# Patient Record
Sex: Female | Born: 1995 | Race: Black or African American | Hispanic: No | Marital: Single | Smoking: Never smoker
Health system: Southern US, Community
[De-identification: ages and names within clinical notes are randomized; demographics above are authoritative.]

## PROBLEM LIST (undated history)

## (undated) ENCOUNTER — Inpatient Hospital Stay (HOSPITAL_COMMUNITY): Payer: Self-pay

## (undated) DIAGNOSIS — Z789 Other specified health status: Secondary | ICD-10-CM

## (undated) DIAGNOSIS — O0930 Supervision of pregnancy with insufficient antenatal care, unspecified trimester: Secondary | ICD-10-CM

## (undated) DIAGNOSIS — R51 Headache: Secondary | ICD-10-CM

## (undated) DIAGNOSIS — B009 Herpesviral infection, unspecified: Secondary | ICD-10-CM

## (undated) DIAGNOSIS — G43909 Migraine, unspecified, not intractable, without status migrainosus: Secondary | ICD-10-CM

## (undated) DIAGNOSIS — K219 Gastro-esophageal reflux disease without esophagitis: Secondary | ICD-10-CM

## (undated) DIAGNOSIS — M069 Rheumatoid arthritis, unspecified: Secondary | ICD-10-CM

## (undated) HISTORY — DX: Rheumatoid arthritis, unspecified: M06.9

## (undated) HISTORY — DX: Gastro-esophageal reflux disease without esophagitis: K21.9

## (undated) HISTORY — PX: NO PAST SURGERIES: SHX2092

## (undated) HISTORY — DX: Migraine, unspecified, not intractable, without status migrainosus: G43.909

## (undated) HISTORY — DX: Supervision of pregnancy with insufficient antenatal care, unspecified trimester: O09.30

---
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
## 2015-05-23 ENCOUNTER — Encounter (HOSPITAL_COMMUNITY): Payer: Self-pay | Admitting: *Deleted

## 2015-05-23 ENCOUNTER — Emergency Department (HOSPITAL_COMMUNITY)
Admission: EM | Admit: 2015-05-23 | Discharge: 2015-05-24 | Discharge status: Against Medical Advice | Payer: BLUE CROSS/BLUE SHIELD | Attending: Emergency Medicine | Admitting: Emergency Medicine

## 2015-05-23 DIAGNOSIS — B20 Human immunodeficiency virus [HIV] disease: Secondary | ICD-10-CM | POA: Insufficient documentation

## 2015-05-23 DIAGNOSIS — J069 Acute upper respiratory infection, unspecified: Secondary | ICD-10-CM | POA: Diagnosis present

## 2015-05-23 NOTE — ED Notes (Signed)
Pt states that she has had cough and congestion for 1 month; pt states that she began to get worse one day last week; pt states that she now has pressure to head and ears; worse when she bends over; pt c/o cough and congestion; pt states that she sometimes has a productive cough with yellow sputum

## 2015-06-02 ENCOUNTER — Encounter (HOSPITAL_COMMUNITY): Payer: Self-pay | Admitting: *Deleted

## 2015-06-02 ENCOUNTER — Emergency Department (HOSPITAL_COMMUNITY)
Admission: EM | Admit: 2015-06-02 | Discharge: 2015-06-03 | Disposition: A | Discharge status: Home / Self Care | Payer: BLUE CROSS/BLUE SHIELD | Attending: Emergency Medicine | Admitting: Emergency Medicine

## 2015-06-02 DIAGNOSIS — R05 Cough: Secondary | ICD-10-CM

## 2015-06-02 DIAGNOSIS — Z8619 Personal history of other infectious and parasitic diseases: Secondary | ICD-10-CM | POA: Diagnosis not present

## 2015-06-02 DIAGNOSIS — Z79899 Other long term (current) drug therapy: Secondary | ICD-10-CM | POA: Diagnosis not present

## 2015-06-02 DIAGNOSIS — Z792 Long term (current) use of antibiotics: Secondary | ICD-10-CM | POA: Diagnosis not present

## 2015-06-02 DIAGNOSIS — R059 Cough, unspecified: Secondary | ICD-10-CM

## 2015-06-02 DIAGNOSIS — Z880 Allergy status to penicillin: Secondary | ICD-10-CM | POA: Diagnosis not present

## 2015-06-02 DIAGNOSIS — Z8679 Personal history of other diseases of the circulatory system: Secondary | ICD-10-CM | POA: Diagnosis not present

## 2015-06-02 DIAGNOSIS — J0100 Acute maxillary sinusitis, unspecified: Secondary | ICD-10-CM | POA: Insufficient documentation

## 2015-06-02 NOTE — ED Notes (Signed)
Pt states that she has had cold sx for approx 1 month; pt states that over the last week the congestion has gotten worse and she has pressure to her, cough and congestion; pt also c/o body aches

## 2015-06-03 ENCOUNTER — Emergency Department (HOSPITAL_COMMUNITY): Payer: BLUE CROSS/BLUE SHIELD

## 2015-06-03 DIAGNOSIS — J0100 Acute maxillary sinusitis, unspecified: Secondary | ICD-10-CM | POA: Diagnosis not present

## 2015-06-03 LAB — RAPID STREP SCREEN (MED CTR MEBANE ONLY): Streptococcus, Group A Screen (Direct): NEGATIVE

## 2015-06-03 MED ORDER — SULFAMETHOXAZOLE-TRIMETHOPRIM 800-160 MG PO TABS
1.0000 | ORAL_TABLET | Freq: Once | ORAL | Status: AC
  Administered 2015-06-03: 1 via ORAL
  Filled 2015-06-03 (×2): qty 1

## 2015-06-03 MED ORDER — DEXTROMETHORPHAN POLISTIREX ER 30 MG/5ML PO SUER: 30.0000 mg | ORAL | Status: DC | PRN

## 2015-06-03 MED ORDER — SULFAMETHOXAZOLE-TRIMETHOPRIM 800-160 MG PO TABS: 1.0000 | ORAL_TABLET | Freq: Two times a day (BID) | ORAL | Status: AC

## 2015-06-03 NOTE — Discharge Instructions (Signed)
Take Bactrim as directed until gone. Take delsym as needed for cough. Refer to attached documents for more information. Return to the ED with worsening or concerning symptoms.

## 2015-06-03 NOTE — ED Provider Notes (Signed)
CSN: [id number]     Arrival date & time 06/02/15  2333 History   First MD Initiated Contact with Patient 06/02/15 2352     Chief Complaint  Patient presents with  . URI     (Consider location/radiation/quality/duration/timing/severity/associated sxs/prior Treatment) Patient is a 18 y.o. female presenting with URI. The history is provided by the patient. No language interpreter was used.  URI Presenting symptoms: congestion, cough and sore throat   Congestion:    Location:  Nasal   Interferes with sleep: yes     Interferes with eating/drinking: yes   Severity:  Moderate Onset quality:  Gradual Duration:  1 month Timing:  Constant Progression:  Unchanged Chronicity:  New Relieved by:  Nothing Worsened by:  Nothing tried Ineffective treatments:  None tried Associated symptoms: sinus pain   Associated symptoms: no arthralgias, no headaches, no myalgias, no neck pain, no sneezing and no swollen glands   Risk factors: not elderly, no chronic cardiac disease, no chronic kidney disease, no chronic respiratory disease, no diabetes mellitus, no immunosuppression, no recent illness, no recent travel and no sick contacts     Past Medical History  Diagnosis Date  . No pertinent past medical history   . Headache(784.0)     migraines dx about 2 years ago  . HSV infection   . Migraine   . Late prenatal care    Past Surgical History  Procedure Laterality Date  . No past surgeries     Family History  Problem Relation Age of Onset  . Hypertension Mother   . Anemia Mother   . Migraines Mother   . Diabetes Father   . Deep vein thrombosis Maternal Grandmother   . Hypertension Maternal Grandmother   . Stroke Maternal Grandmother   . Heart attack Paternal Grandmother   . Hypertension Paternal Grandmother   . Diabetes Paternal Grandmother   . Gout Paternal Grandmother    Social History  Substance Use Topics  . Smoking status: Never Smoker   . Smokeless tobacco: Never Used  .  Alcohol Use: No   OB History    Gravida Para Term Preterm AB TAB SAB Ectopic Multiple Living   1 1 1       1      Review of Systems  HENT: Positive for congestion, sinus pressure and sore throat. Negative for sneezing.   Respiratory: Positive for cough.   Musculoskeletal: Negative for myalgias, arthralgias and neck pain.  Neurological: Negative for headaches.  All other systems reviewed and are negative.     Allergies  Penicillins  Home Medications   Prior to Admission medications   Medication Sig Start Date End Date Taking? Authorizing Provider  clindamycin (CLEOCIN) 300 MG capsule Take 1 capsule (300 mg total) by mouth 4 (four) times daily. 02/15/14   04/17/14, MD  HYDROcodone-acetaminophen (NORCO/VICODIN) 5-325 MG per tablet 1 to 2 tabs every 4 to 6 hours as needed for pain. 02/15/14   04/17/14, MD  ibuprofen (ADVIL,MOTRIN) 600 MG tablet Take 1 tablet (600 mg total) by mouth every 6 (six) hours. 07/17/13   13/8/14, MD  mupirocin ointment (BACTROBAN) 2 % Apply 1 application topically 3 (three) times daily. 02/15/14   04/17/14, MD  Prenatal Vit-Fe Fumarate-FA (PRENATAL MULTIVITAMIN) TABS tablet Take 1 tablet by mouth daily at 12 noon.    Historical Provider, MD   BP 109/63 mmHg  Pulse 101  Temp(Src) 98.4 F (36.9 C) (Oral)  Resp 20  SpO2  97%  LMP 04/24/2015 Physical Exam  Constitutional: She is oriented to person, place, and time. She appears well-developed and well-nourished. No distress.  HENT:  Head: Normocephalic and atraumatic.  Bilateral tonsillar edema, erythema and exudate.   Eyes: Conjunctivae and EOM are normal.  Neck: Normal range of motion.  Cardiovascular: Normal rate and regular rhythm.  Exam reveals no gallop and no friction rub.   No murmur heard. Pulmonary/Chest: Effort normal and breath sounds normal. She has no wheezes. She has no rales. She exhibits no tenderness.  Abdominal: Soft. She exhibits no distension. There is no  tenderness.  Musculoskeletal: Normal range of motion.  Lymphadenopathy:    She has cervical adenopathy.  Neurological: She is alert and oriented to person, place, and time. Coordination normal.  Speech is goal-oriented. Moves limbs without ataxia.   Skin: Skin is warm and dry.  Psychiatric: She has a normal mood and affect. Her behavior is normal.  Nursing note and vitals reviewed.   ED Course  Procedures (including critical care time) Labs Review Labs Reviewed  RAPID STREP SCREEN (NOT AT [facility])    Imaging Review Dg Chest 2 View  06/03/2015   CLINICAL DATA:  Generalized chest pain.  Cough and congestion.  EXAM: CHEST  2 VIEW  COMPARISON:  None.  FINDINGS: The heart size and mediastinal contours are within normal limits. Both lungs are clear. The visualized skeletal structures are unremarkable.  IMPRESSION: No active cardiopulmonary disease.   Electronically Signed   By: Annia Belt M.D.   On: 06/03/2015 00:21   I have personally reviewed and evaluated these images and lab results as part of my medical decision-making.   EKG Interpretation None      MDM   Final diagnoses:  Acute maxillary sinusitis, recurrence not specified  Cough    12:32 AM Chest xray unremarkable for acute changes. Rapid strep pending. Vitals stable and patient afebrile. Patient will be treated for sinus infection.    [address] [place], PA-C 06/03/15 0947  Raeford Razor, MD 06/05/15 1415

## 2015-06-05 LAB — CULTURE, GROUP A STREP

## 2016-07-27 IMAGING — CR DG CHEST 2V
2 series · 2 of 2 positions shown · non-contrast
Comparison: None.

CLINICAL DATA: Generalized chest pain.  Cough and congestion.

EXAM:
CHEST  2 VIEW

[w chest pa]
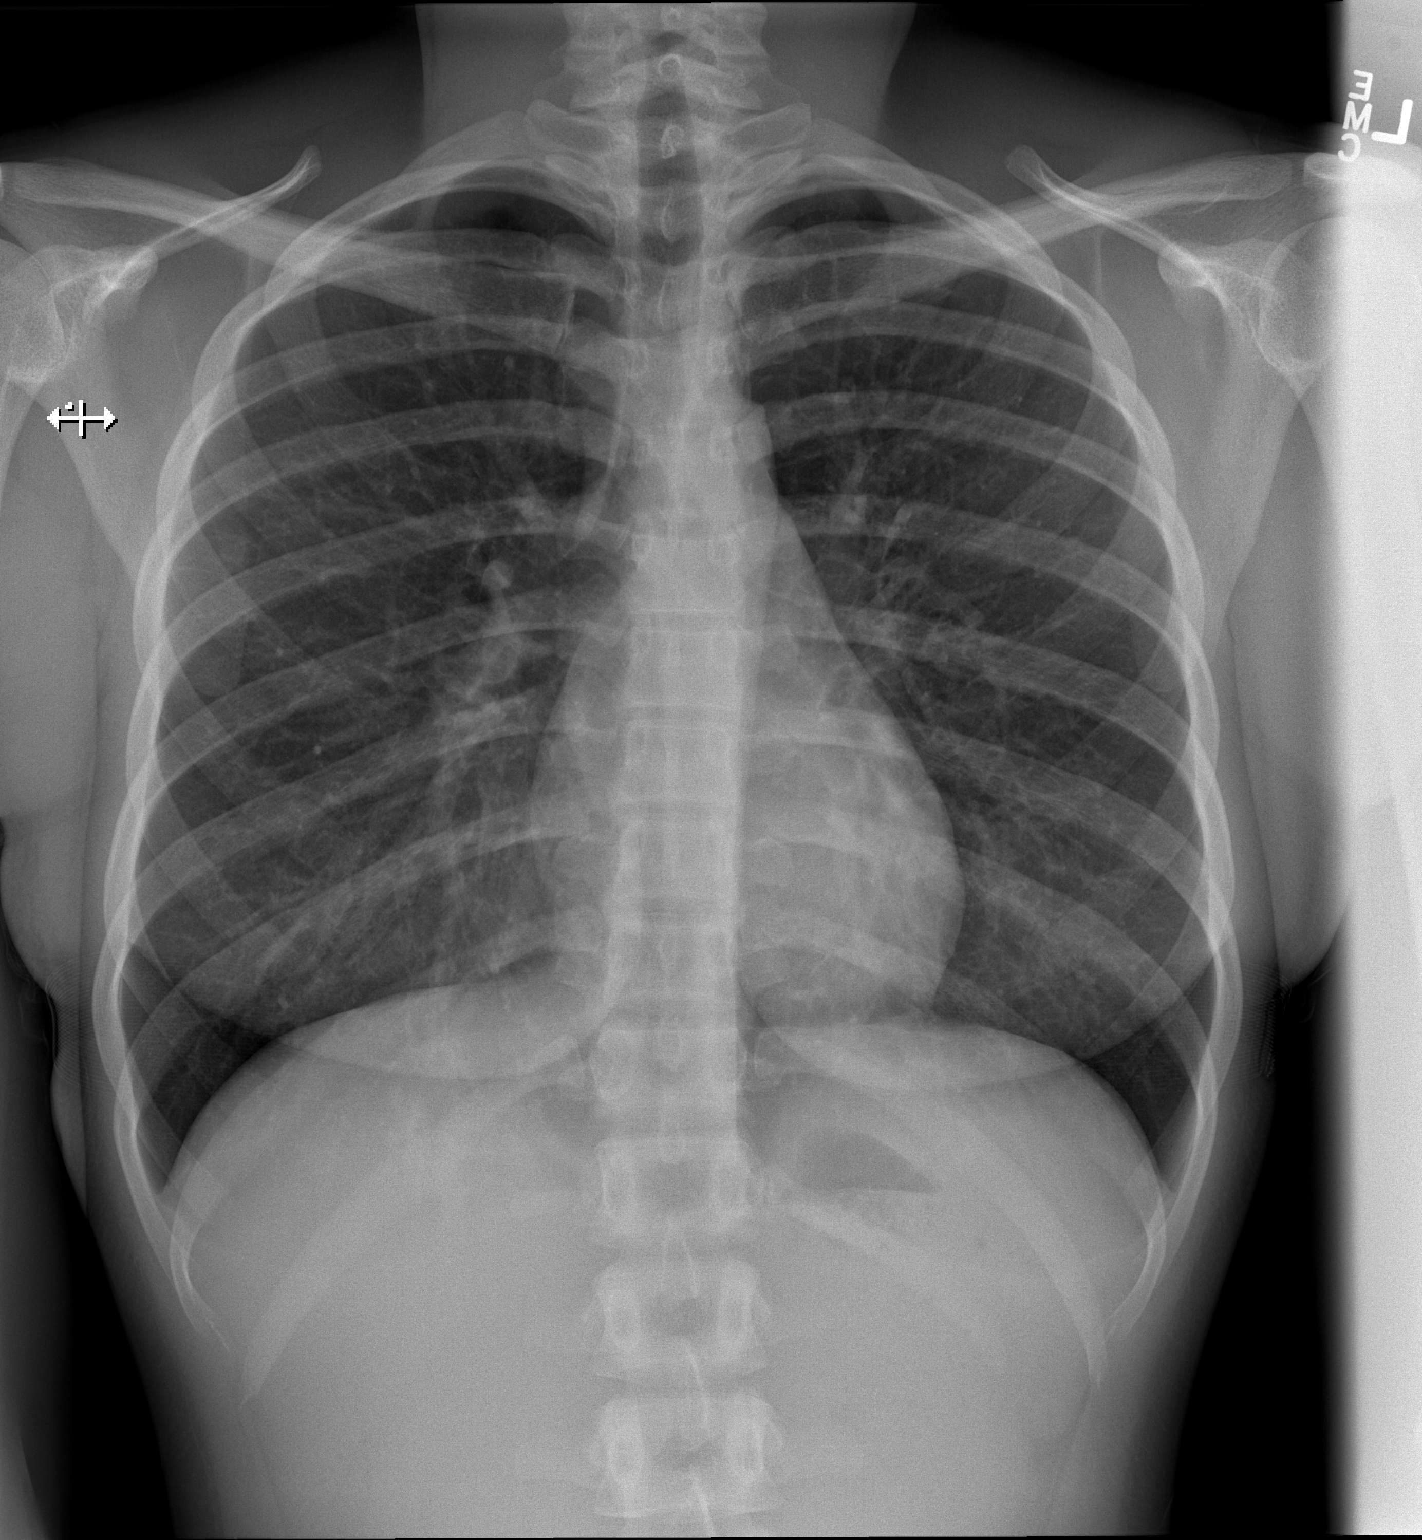

[w chest lat]
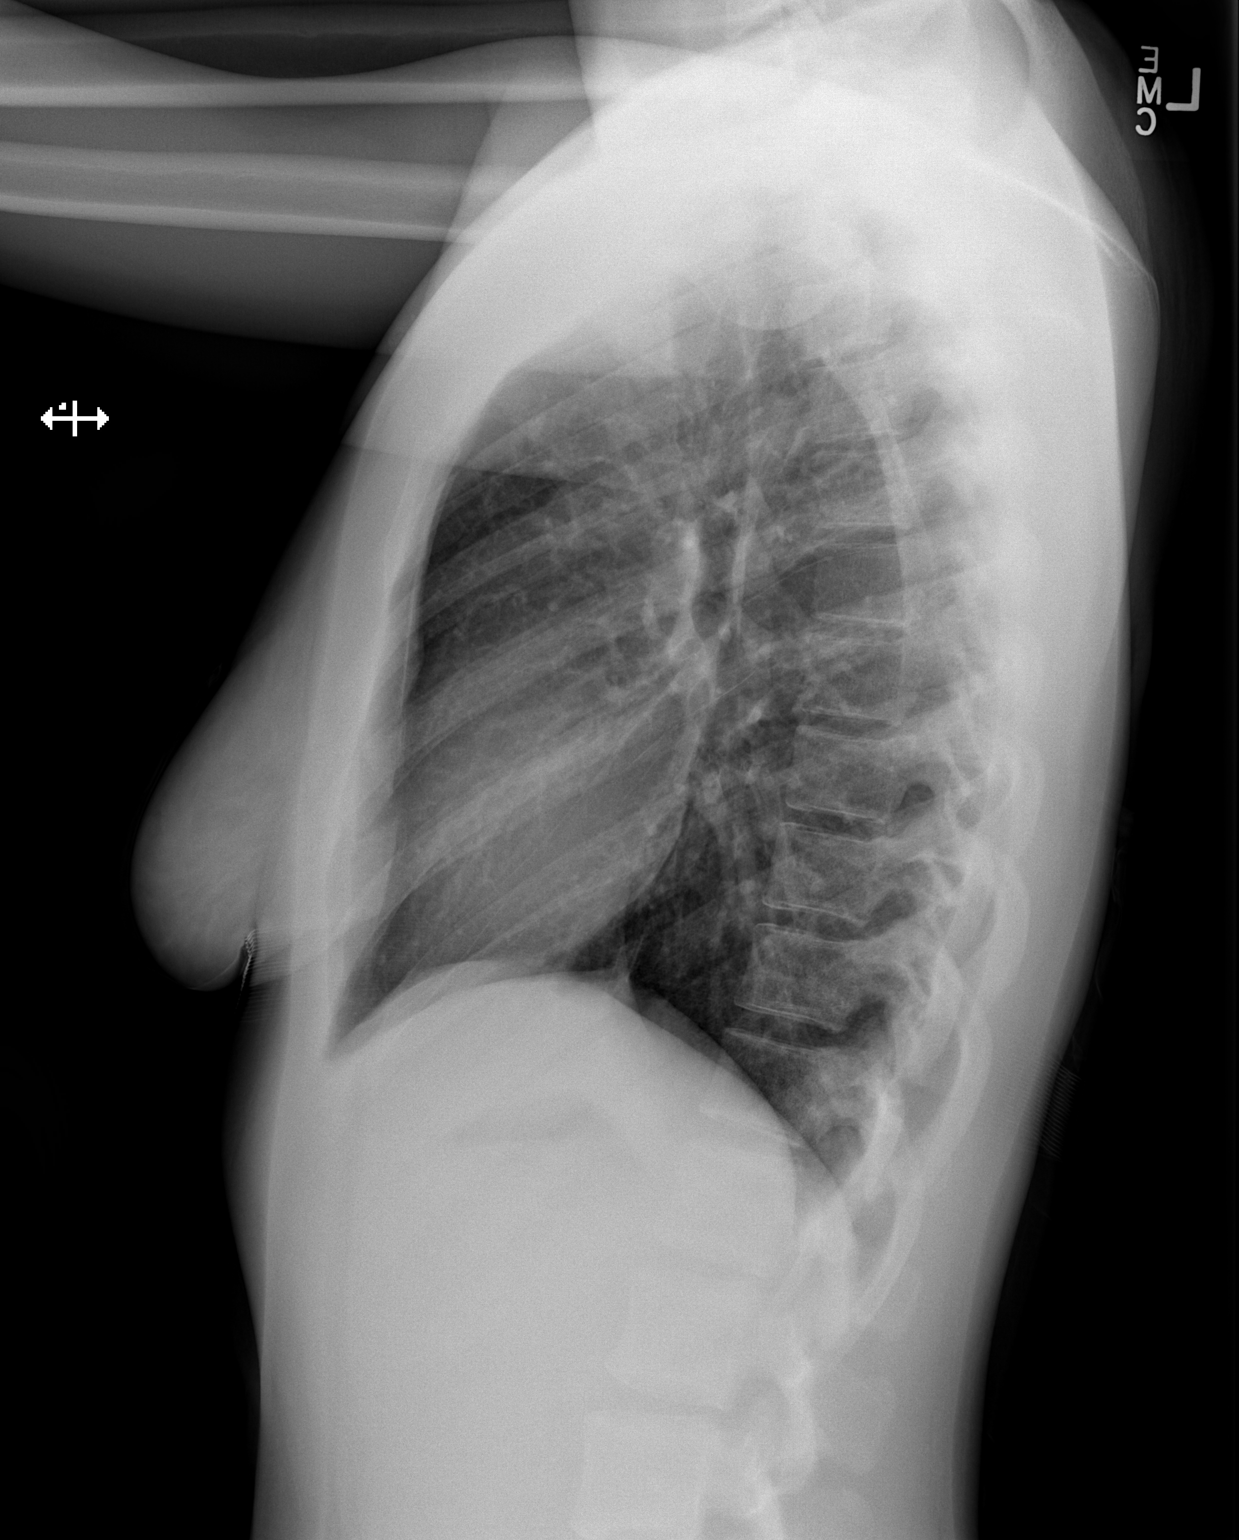

[2 of 2 positions shown; findings below may reference images not displayed]

FINDINGS: The heart size and mediastinal contours are within normal limits.
Both lungs are clear. The visualized skeletal structures are
unremarkable.
IMPRESSION: No active cardiopulmonary disease.

## 2017-06-04 ENCOUNTER — Encounter (HOSPITAL_COMMUNITY): Payer: Self-pay | Admitting: Emergency Medicine

## 2017-06-04 DIAGNOSIS — Z880 Allergy status to penicillin: Secondary | ICD-10-CM | POA: Insufficient documentation

## 2017-06-04 DIAGNOSIS — R52 Pain, unspecified: Secondary | ICD-10-CM | POA: Insufficient documentation

## 2017-06-04 DIAGNOSIS — Z79899 Other long term (current) drug therapy: Secondary | ICD-10-CM | POA: Insufficient documentation

## 2017-06-04 LAB — URINALYSIS, ROUTINE W REFLEX MICROSCOPIC
BILIRUBIN URINE: NEGATIVE
Glucose, UA: NEGATIVE mg/dL
HGB URINE DIPSTICK: NEGATIVE
Ketones, ur: NEGATIVE mg/dL
Leukocytes, UA: NEGATIVE
NITRITE: NEGATIVE
PH: 5 (ref 5.0–8.0)
Protein, ur: NEGATIVE mg/dL
SPECIFIC GRAVITY, URINE: 1.028 (ref 1.005–1.030)

## 2017-06-04 LAB — BASIC METABOLIC PANEL
Anion gap: 8 (ref 5–15)
BUN: 14 mg/dL (ref 6–20)
CHLORIDE: 106 mmol/L (ref 101–111)
CO2: 20 mmol/L — AB (ref 22–32)
CREATININE: 0.7 mg/dL (ref 0.44–1.00)
Calcium: 9.5 mg/dL (ref 8.9–10.3)
GFR calc non Af Amer: >60 mL/min (ref >60)
Glucose, Bld: 80 mg/dL (ref 65–99)
POTASSIUM: 3.8 mmol/L (ref 3.5–5.1)
SODIUM: 134 mmol/L — AB (ref 135–145)

## 2017-06-04 LAB — CBC
HEMATOCRIT: 38.2 % (ref 36.0–46.0)
Hemoglobin: 13 g/dL (ref 12.0–15.0)
MCH: 31.6 pg (ref 26.0–34.0)
MCHC: 34 g/dL (ref 30.0–36.0)
MCV: 92.9 fL (ref 78.0–100.0)
Platelets: 205 10*3/uL (ref 150–400)
RBC: 4.11 MIL/uL (ref 3.87–5.11)
RDW: 11.9 % (ref 11.5–15.5)
WBC: 8 10*3/uL (ref 4.0–10.5)

## 2017-06-04 LAB — HCG, QUANTITATIVE, PREGNANCY

## 2017-06-04 MED ORDER — IBUPROFEN 400 MG PO TABS
ORAL_TABLET | ORAL | Status: AC
  Filled 2017-06-04: qty 1

## 2017-06-04 MED ORDER — IBUPROFEN 400 MG PO TABS
400.0000 mg | ORAL_TABLET | Freq: Once | ORAL | Status: AC | PRN
  Administered 2017-06-04: 400 mg via ORAL

## 2017-06-04 NOTE — ED Triage Notes (Signed)
Pt reports gen body aches for several months. She has been to see her PCP who referred her to a specialist. Pt in not able to recall what her PCP said is wrong or who the specialty doctor is.

## 2017-06-05 ENCOUNTER — Emergency Department (HOSPITAL_COMMUNITY)
Admission: EM | Admit: 2017-06-05 | Discharge: 2017-06-05 | Disposition: A | Discharge status: Home / Self Care | Payer: BLUE CROSS/BLUE SHIELD | Attending: Emergency Medicine | Admitting: Emergency Medicine

## 2017-06-05 DIAGNOSIS — R52 Pain, unspecified: Secondary | ICD-10-CM

## 2017-06-05 NOTE — Discharge Instructions (Signed)
Please return for fever, vomiting, chest pain, or abdominal pain.    Please follow-up with your primary care doctor.

## 2017-06-05 NOTE — ED Provider Notes (Signed)
[facility]-EMERGENCY DEPT Provider Note   CSN: [id number] Arrival date & time: 06/04/17  3354     History   Chief Complaint Chief Complaint  Patient presents with  . Generalized Body Aches    HPI Michele Gibson is a 21 y.o. female.  Patient presents emergency department with chief complaint of generalized body aches 5 years. 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 She denies any fevers, chills, nausea, vomiting. Denies any chest pain or abdominal pain. Denies any other associated symptoms. She has not taken anything for symptoms. There are no modifying factors.   The history is provided by the patient. No language interpreter was used.    Past Medical History:  Diagnosis Date  . Headache(784.0)    migraines dx about 2 years ago  . HSV infection   . Late prenatal care   . Migraine   . No pertinent past medical history     Patient Active Problem List   Diagnosis Date Noted  . 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 NO PAST SURGERIES      OB History    Gravida Para Term Preterm AB Living   1 1 1     1    SAB TAB Ectopic Multiple Live Births           1       Home Medications    Prior to Admission medications   Medication Sig Start Date End Date Taking? Authorizing Provider  medroxyPROGESTERone (DEPO-PROVERA) 150 MG/ML injection Inject 150 mg into the muscle every 3 (three) months.   Yes [provider]  SUMAtriptan (IMITREX) 50 MG tablet Take 50 mg by mouth every 2 (two) hours as needed for migraine. May repeat in 2 hours if headache persists or recurs.   Yes [provider]    Family History Family History  Problem Relation Age of Onset  . Hypertension Mother   . Anemia Mother   . Migraines Mother   . Diabetes Father   . Deep vein thrombosis Maternal Grandmother   . Hypertension Maternal Grandmother   . Stroke Maternal Grandmother   . Heart attack  Paternal Grandmother   . Hypertension Paternal Grandmother   . Diabetes Paternal Grandmother   . Gout Paternal Grandmother     Social History Social History  Substance Use Topics  . Smoking status: Never Smoker  . Smokeless tobacco: Never Used  . Alcohol use No     Allergies   Penicillins   Review of Systems Review of Systems  All other systems reviewed and are negative.    Physical Exam Updated Vital Signs BP 115/85   Pulse 74   Temp 98.3 F (36.8 C) (Oral)   Resp 16   Ht 5\' 7"  (1.702 m)   Wt 63.5 kg (140 lb)   SpO2 100%   BMI 21.93 kg/m   Physical Exam  Constitutional: She is oriented to person, place, and time. She appears well-developed and well-nourished.  HENT:  Head: Normocephalic and atraumatic.  Eyes: Pupils are equal, round, and reactive to light. Conjunctivae and EOM are normal.  Neck: Normal range of motion. Neck supple.  Cardiovascular: Normal rate and regular rhythm.  Exam reveals no gallop and no friction rub.   No murmur heard. Pulmonary/Chest: Effort normal and breath sounds normal. No respiratory distress. She has no wheezes. She has no rales. She exhibits no tenderness.  Abdominal: Soft.  Bowel sounds are normal. She exhibits no distension and no mass. There is no tenderness. There is no rebound and no guarding.  Musculoskeletal: Normal range of motion. She exhibits no edema or tenderness.  Neurological: She is alert and oriented to person, place, and time.  Skin: Skin is warm and dry.  Psychiatric: She has a normal mood and affect. Her behavior is normal. Judgment and thought content normal.  Nursing note and vitals reviewed.    ED Treatments / Results  Labs (all labs ordered are listed, but only abnormal results are displayed) Labs Reviewed  BASIC METABOLIC PANEL - Abnormal; Notable for the following:       Result Value   Sodium 134 (*)    CO2 20 (*)    All other components within normal limits  CBC  URINALYSIS, ROUTINE W REFLEX  MICROSCOPIC  HCG, QUANTITATIVE, PREGNANCY    EKG  EKG Interpretation None       Radiology No results found.  Procedures Procedures (including critical care time)  Medications Ordered in ED Medications  ibuprofen (ADVIL,MOTRIN) tablet 400 mg (400 mg Oral Given 06/04/17 2046)     Initial Impression / Assessment and Plan / ED Course  I have reviewed the triage vital signs and the nursing notes.  Pertinent labs & imaging results that were available during my care of the patient were reviewed by me and considered in my medical decision making (see chart for details).     Patient with chronic body aches 5 years. No acute findings on exam or laboratory workup today. Vital signs are stable. Patient is well-appearing. Recommend primary care/outpatient follow-up.  Final Clinical Impressions(s) / ED Diagnoses   Final diagnoses:  Body aches    New Prescriptions New Prescriptions   No medications on file     Roxy Horseman, Cordelia Poche 06/05/17 0248    Ward, Layla Maw, DO 06/05/17 [phone]

## 2019-08-24 DIAGNOSIS — Z8739 Personal history of other diseases of the musculoskeletal system and connective tissue: Secondary | ICD-10-CM | POA: Insufficient documentation

## 2020-09-22 ENCOUNTER — Ambulatory Visit (INDEPENDENT_AMBULATORY_CARE_PROVIDER_SITE_OTHER): Payer: BC Managed Care – PPO

## 2020-09-22 ENCOUNTER — Other Ambulatory Visit: Payer: Self-pay

## 2020-09-22 DIAGNOSIS — Z23 Encounter for immunization: Secondary | ICD-10-CM

## 2020-09-22 NOTE — Progress Notes (Signed)
   Covid-19 Vaccination Clinic  Name:  Michele Gibson    MRN: [id number] DOB: 01-21-1996  09/22/2020  [name] was observed post Covid-19 immunization for 15 minutes without incident. She was provided with Vaccine Information Sheet and instruction to access the V-Safe system.   [name] was instructed to call 911 with any severe reactions post vaccine: Marland Kitchen Difficulty breathing  . Swelling of face and throat  . A fast heartbeat  . A bad rash all over body  . Dizziness and weakness   Immunizations Administered    Name Date Dose VIS Date Route   Pfizer COVID-19 Vaccine 09/22/2020 10:17 AM 0.3 mL 06/28/2020 Intramuscular   Manufacturer: [employer], [employer]   Lot: [id number]   NDC: [address]-2

## 2020-11-05 DIAGNOSIS — G43909 Migraine, unspecified, not intractable, without status migrainosus: Secondary | ICD-10-CM | POA: Insufficient documentation

## 2020-11-13 DIAGNOSIS — K2970 Gastritis, unspecified, without bleeding: Secondary | ICD-10-CM | POA: Insufficient documentation

## 2020-11-21 NOTE — Progress Notes (Signed)
[place] Telephone:([phone]) [phone]   Fax:([phone]) [phone]  INITIAL CONSULT NOTE  Patient Care Team: Patient, No Pcp Per as PCP - General (General Practice)  Hematological/Oncological History # Leukocytosis 1) 10/26/2020: WBC 11.0 (nml 3.8-10.8), Hgb 13.6, MCV 89.7, Plt 280. Neutrophilic predominance 2) 0/26/3785: establish care with Dr. Lorenso Courier   CHIEF COMPLAINTS/PURPOSE OF CONSULTATION:  "Leukocytosis "  HISTORY OF PRESENTING ILLNESS:  Michele Gibson 25 y.o. female with medical history significant for HSV and migraine headaches who presents for evaluation of leukocytosis.    On review of the previous records, patient was found to have elevated white blood cell count on 10/26/2020.  Repeat labs from 11/13/2020 revealed persistent elevation with a WBC of 14.1.  Due to diffuse joint pain, patient was referred to rheumatology for further work-up.  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  She has a good appetite without any noticeable weight loss.  Patient denies any nausea or vomiting.  She does report intermittent episodes of postprandial abdominal cramping with certain foods including bread and dairy.  She has regular bowel movements without any hematochezia or melena.  Patient denies any dysuria or hematuria.  Since 2018, patient reports diffuse joint pain, mainly from the hips to her lower extremities.  Patient reports that in 2018, she saw a rheumatologist and was told she had rheumatoid arthritis.  She is currently taking extra strength Tylenol, 2 tablets a day with some improvement of pain.  Patient reports that her joint pain was 8 out of 10 on the pain scale yesterday, requiring her to leave work early.  Patient reports chronic migraines, with 4-5 episodes per week.  She currently takes Nurtec ODT as needed with moderate improvement of her headaches.  Patient denies any fevers, chills,  drenching night sweats shortness of breath, chest pain or cough.  She has no other complaints. A complete 10 point ROS is well.  MEDICAL HISTORY:  Past Medical History:  Diagnosis Date  . GERD (gastroesophageal reflux disease)   . Headache(784.0)    migraines dx about 2 years ago  . HSV infection   . Late prenatal care   . Migraine   . No pertinent past medical history     SURGICAL HISTORY: Past Surgical History:  Procedure Laterality Date  . NO PAST SURGERIES      SOCIAL HISTORY: Social History   Socioeconomic History  . Marital status: Single    Spouse name: Not on file  . Number of children: Not on file  . Years of education: Not on file  . Highest education level: Not on file  Occupational History  . Not on file  Tobacco Use  . Smoking status: Never Smoker  . Smokeless tobacco: Never Used  Substance and Sexual Activity  . Alcohol use: Yes    Comment: occasionally  . Drug use: No  . Sexual activity: Not Currently    Birth control/protection: None  Other Topics Concern  . Not on file  Social History Narrative  . Not on file   Social Determinants of Health   Financial Resource Strain: Not on file  Food Insecurity: Not on file  Transportation Needs: Not on file  Physical Activity: Not on file  Stress: Not on file  Social Connections: Not on file  Intimate Partner Violence: Not on file    FAMILY HISTORY: Family History  Problem Relation Age of Onset  . Hypertension  Mother   . Anemia Mother   . Migraines Mother   . Lung cancer Mother   . Diabetes Father   . Deep vein thrombosis Maternal Grandmother   . Hypertension Maternal Grandmother   . Stroke Maternal Grandmother   . Cancer Maternal Grandmother   . Heart attack Paternal Grandmother   . Hypertension Paternal Grandmother   . Diabetes Paternal Grandmother   . Gout Paternal Grandmother   . Stomach cancer Paternal Uncle     ALLERGIES:  is allergic to penicillins.  MEDICATIONS:  Current  Outpatient Medications  Medication Sig Dispense Refill  . pantoprazole (PROTONIX) 40 MG tablet pantoprazole 40 mg tablet,delayed release  TAKE 1 TABLET BY MOUTH EVERY DAY    . SUMAtriptan (IMITREX) 50 MG tablet Take 50 mg by mouth every 2 (two) hours as needed for migraine. May repeat in 2 hours if headache persists or recurs.     No current facility-administered medications for this visit.    REVIEW OF SYSTEMS:   Constitutional: ( - ) fevers, ( - )  chills , ( - ) night sweats Eyes: ( - ) blurriness of vision, ( - ) double vision, ( - ) watery eyes Ears, nose, mouth, throat, and face: ( - ) mucositis, ( - ) sore throat Respiratory: ( - ) cough, ( - ) dyspnea, ( - ) wheezes Cardiovascular: ( - ) palpitation, ( - ) chest discomfort, ( - ) lower extremity swelling Gastrointestinal:  ( - ) nausea, ( - ) heartburn, ( - ) change in bowel habits Skin: ( - ) abnormal skin rashes Lymphatics: ( - ) new lymphadenopathy, ( - ) easy bruising Neurological: ( - ) numbness, ( - ) tingling, ( - ) new weaknesses Behavioral/Psych: ( - ) mood change, ( - ) new changes  All other systems were reviewed with the patient and are negative.  PHYSICAL EXAMINATION: ECOG PERFORMANCE STATUS: 1 - Symptomatic but completely ambulatory  Vitals:   11/22/20 0859  BP: 120/82  Pulse: 89  Resp: 17  Temp: 97.6 F (36.4 C)  SpO2: 100%   Filed Weights   11/22/20 0859  Weight: 230 lb 1.6 oz (104.4 kg)    GENERAL: well appearing female in NAD  SKIN: skin color, texture, turgor are normal, no rashes or significant lesions EYES: conjunctiva are pink and non-injected, sclera clear OROPHARYNX: no exudate, no erythema; lips, buccal mucosa, and tongue normal  NECK: supple, non-tender LYMPH:  no palpable lymphadenopathy in the cervical, axillary or supraclavicular lymph nodes.  LUNGS: clear to auscultation and percussion with normal breathing effort HEART: regular rate & rhythm and no murmurs and no lower extremity  edema ABDOMEN: soft, non-tender, non-distended, normal bowel sounds Musculoskeletal: no cyanosis of digits and no clubbing  PSYCH: alert & oriented x 3, fluent speech NEURO: no focal motor/sensory deficits  LABORATORY DATA:  I have reviewed the data as listed CBC Latest Ref Rng & Units 11/22/2020 06/04/2017 07/16/2013  WBC 4.0 - 10.5 K/uL 9.1 8.0 12.2  Hemoglobin 12.0 - 15.0 g/dL 13.3 13.0 9.9(L)  Hematocrit 36.0 - 46.0 % 41.0 38.2 28.8(L)  Platelets 150 - 400 K/uL 274 205 116(L)    CMP Latest Ref Rng & Units 11/22/2020 06/04/2017 06/29/2013  Glucose 70 - 99 mg/dL 88 80 73  BUN 6 - 20 mg/dL 10 14 6   Creatinine 0.44 - 1.00 mg/dL 0.79 0.70 0.57  Sodium 135 - 145 mmol/L 139 134(L) 135  Potassium 3.5 - 5.1 mmol/L 4.2 3.8 4.1  Chloride 98 - 111 mmol/L 107 106 102  CO2 22 - 32 mmol/L 25 20(L) 22  Calcium 8.9 - 10.3 mg/dL 9.2 9.5 9.7  Total Protein 6.5 - 8.1 g/dL 7.5 - 7.0  Total Bilirubin 0.3 - 1.2 mg/dL 0.6 - 0.3  Alkaline Phos 38 - 126 U/L 81 - 145(H)  AST 15 - 41 U/L 27 - 20  ALT 0 - 44 U/L 52(H) - 13   ASSESSMENT & PLAN TORINA EY is a 25 y.o. female presenting to the clinic for further evaluation for leukocytosis.   #Leukocytosis: --will obtain labs today including CBC, CMP, ESR, CRP, and save smear.  --Likely etiology is inflammatory due to diffuse joint pain. Patient's PCP (Dr. Leota Sauers) has initiated a referral to rheumatology. --RTC in 6 months with labs.   Orders Placed This Encounter  Procedures  . CBC with Differential (Cancer Center Only)    Standing Status:   Future    Number of Occurrences:   1    Standing Expiration Date:   11/22/2021  . CMP ([place] only)    Standing Status:   Future    Number of Occurrences:   1    Standing Expiration Date:   11/22/2021  . Sedimentation rate    Standing Status:   Future    Number of Occurrences:   1    Standing Expiration Date:   11/22/2021  . C-reactive protein    Standing Status:   Future    Number of  Occurrences:   1    Standing Expiration Date:   11/22/2021  . Save Smear ([facility])    Standing Status:   Future    Number of Occurrences:   1    Standing Expiration Date:   11/22/2021    All questions were answered. The patient knows to call the clinic with any problems, questions or concerns.  A total of more than 60 minutes were spent on this encounter and over half of that time was spent on counseling and coordination of care as outlined above.   Ledell Peoples, MD Department of Hematology/Oncology [place] [facility] Phone: [phone] Pager: [phone] Email: [email or web address]  11/22/2020 3:28 PM

## 2020-11-22 ENCOUNTER — Inpatient Hospital Stay: Payer: BC Managed Care – PPO

## 2020-11-22 ENCOUNTER — Other Ambulatory Visit: Payer: Self-pay

## 2020-11-22 ENCOUNTER — Encounter: Payer: Self-pay | Admitting: Hematology and Oncology

## 2020-11-22 ENCOUNTER — Inpatient Hospital Stay: Payer: BC Managed Care – PPO | Attending: Hematology and Oncology | Admitting: Hematology and Oncology

## 2020-11-22 ENCOUNTER — Telehealth: Payer: Self-pay | Admitting: Physician Assistant

## 2020-11-22 VITALS — BP 120/82 | HR 89 | Temp 97.6°F | Resp 17 | Ht 67.0 in | Wt 230.1 lb

## 2020-11-22 DIAGNOSIS — Z79899 Other long term (current) drug therapy: Secondary | ICD-10-CM | POA: Diagnosis not present

## 2020-11-22 DIAGNOSIS — M069 Rheumatoid arthritis, unspecified: Secondary | ICD-10-CM | POA: Insufficient documentation

## 2020-11-22 DIAGNOSIS — Z800 Family history of malignant neoplasm of digestive organs: Secondary | ICD-10-CM | POA: Diagnosis not present

## 2020-11-22 DIAGNOSIS — D72829 Elevated white blood cell count, unspecified: Secondary | ICD-10-CM | POA: Insufficient documentation

## 2020-11-22 DIAGNOSIS — Z801 Family history of malignant neoplasm of trachea, bronchus and lung: Secondary | ICD-10-CM | POA: Diagnosis not present

## 2020-11-22 DIAGNOSIS — Z8249 Family history of ischemic heart disease and other diseases of the circulatory system: Secondary | ICD-10-CM | POA: Insufficient documentation

## 2020-11-22 DIAGNOSIS — Z833 Family history of diabetes mellitus: Secondary | ICD-10-CM | POA: Diagnosis not present

## 2020-11-22 DIAGNOSIS — D72825 Bandemia: Secondary | ICD-10-CM

## 2020-11-22 LAB — CBC WITH DIFFERENTIAL (CANCER CENTER ONLY)
Abs Immature Granulocytes: 0.04 10*3/uL (ref 0.00–0.07)
Basophils Absolute: 0 10*3/uL (ref 0.0–0.1)
Basophils Relative: 0 %
Eosinophils Absolute: 0.2 10*3/uL (ref 0.0–0.5)
Eosinophils Relative: 2 %
HCT: 41 % (ref 36.0–46.0)
Hemoglobin: 13.3 g/dL (ref 12.0–15.0)
Immature Granulocytes: 0 %
Lymphocytes Relative: 37 %
Lymphs Abs: 3.3 10*3/uL (ref 0.7–4.0)
MCH: 29.6 pg (ref 26.0–34.0)
MCHC: 32.4 g/dL (ref 30.0–36.0)
MCV: 91.1 fL (ref 80.0–100.0)
Monocytes Absolute: 0.6 10*3/uL (ref 0.1–1.0)
Monocytes Relative: 7 %
Neutro Abs: 4.9 10*3/uL (ref 1.7–7.7)
Neutrophils Relative %: 54 %
Platelet Count: 274 10*3/uL (ref 150–400)
RBC: 4.5 MIL/uL (ref 3.87–5.11)
RDW: 12.2 % (ref 11.5–15.5)
WBC Count: 9.1 10*3/uL (ref 4.0–10.5)
nRBC: 0 % (ref 0.0–0.2)

## 2020-11-22 LAB — SEDIMENTATION RATE: Sed Rate: 18 mm/hr (ref 0–22)

## 2020-11-22 LAB — SAVE SMEAR (SSMR)

## 2020-11-22 LAB — CMP (CANCER CENTER ONLY)
ALT: 52 U/L — ABNORMAL HIGH (ref 0–44)
AST: 27 U/L (ref 15–41)
Albumin: 4 g/dL (ref 3.5–5.0)
Alkaline Phosphatase: 81 U/L (ref 38–126)
Anion gap: 7 (ref 5–15)
BUN: 10 mg/dL (ref 6–20)
CO2: 25 mmol/L (ref 22–32)
Calcium: 9.2 mg/dL (ref 8.9–10.3)
Chloride: 107 mmol/L (ref 98–111)
Creatinine: 0.79 mg/dL (ref 0.44–1.00)
GFR, Estimated: >60 mL/min (ref >60)
Glucose, Bld: 88 mg/dL (ref 70–99)
Potassium: 4.2 mmol/L (ref 3.5–5.1)
Sodium: 139 mmol/L (ref 135–145)
Total Bilirubin: 0.6 mg/dL (ref 0.3–1.2)
Total Protein: 7.5 g/dL (ref 6.5–8.1)

## 2020-11-22 LAB — C-REACTIVE PROTEIN: CRP: 0.6 mg/dL (ref <1.0)

## 2020-11-22 NOTE — Telephone Encounter (Signed)
I called the patient to review today's blood work after presenting to the clinic for evaluation for leukocytosis.  Discussed that today's CBC showed a normal white blood cell count.  In addition, we checked ESR and CRP levels which were both within normal limits.  The recommendation is still to continue with the referral to rheumatology for further work-up for potential rheumatoid arthritis.  Patient will return to the clinic in 6 months with repeat labs.

## 2020-11-23 ENCOUNTER — Telehealth: Payer: Self-pay | Admitting: Hematology and Oncology

## 2020-11-23 NOTE — Telephone Encounter (Signed)
Scheduled per los. Called and left msg. Mailed printout  °

## 2020-12-21 ENCOUNTER — Ambulatory Visit (INDEPENDENT_AMBULATORY_CARE_PROVIDER_SITE_OTHER): Payer: BC Managed Care – PPO | Admitting: Advanced Practice Midwife

## 2020-12-21 ENCOUNTER — Other Ambulatory Visit: Payer: Self-pay

## 2020-12-21 ENCOUNTER — Encounter: Payer: Self-pay | Admitting: Advanced Practice Midwife

## 2020-12-21 VITALS — BP 117/77 | HR 100 | Ht 67.0 in | Wt 233.0 lb

## 2020-12-21 DIAGNOSIS — Z3202 Encounter for pregnancy test, result negative: Secondary | ICD-10-CM

## 2020-12-21 DIAGNOSIS — Z30017 Encounter for initial prescription of implantable subdermal contraceptive: Secondary | ICD-10-CM

## 2020-12-21 DIAGNOSIS — Z113 Encounter for screening for infections with a predominantly sexual mode of transmission: Secondary | ICD-10-CM | POA: Diagnosis not present

## 2020-12-21 DIAGNOSIS — R1030 Lower abdominal pain, unspecified: Secondary | ICD-10-CM

## 2020-12-21 DIAGNOSIS — N911 Secondary amenorrhea: Secondary | ICD-10-CM | POA: Insufficient documentation

## 2020-12-21 DIAGNOSIS — Z3009 Encounter for other general counseling and advice on contraception: Secondary | ICD-10-CM

## 2020-12-21 DIAGNOSIS — Z975 Presence of (intrauterine) contraceptive device: Secondary | ICD-10-CM | POA: Insufficient documentation

## 2020-12-21 DIAGNOSIS — N898 Other specified noninflammatory disorders of vagina: Secondary | ICD-10-CM

## 2020-12-21 DIAGNOSIS — Z01419 Encounter for gynecological examination (general) (routine) without abnormal findings: Secondary | ICD-10-CM | POA: Diagnosis not present

## 2020-12-21 LAB — POCT URINE PREGNANCY: Preg Test, Ur: NEGATIVE

## 2020-12-21 MED ORDER — ETONOGESTREL 68 MG ~~LOC~~ IMPL
68.0000 mg | DRUG_IMPLANT | Freq: Once | SUBCUTANEOUS | Status: AC
  Administered 2020-12-21: 68 mg via SUBCUTANEOUS

## 2020-12-21 NOTE — Progress Notes (Signed)
Subjective:     Michele Gibson is a 25 y.o. female here at [facility] for a routine exam.  Current complaints: Pt is not having periods. She has some vaginal discharge without odor or itching and occasional abdominal pain.  Pain is associated with eating.   Personal health questionnaire reviewed: yes.  Do you have a primary care provider? yes Do you feel safe at home? yes    Risk factors for chronic health problems: Smoking: Never Alchohol/how much: Not asked Pt BMI: Body mass index is 36.49 kg/m.   Gynecologic History No LMP recorded. Patient has had an injection. Contraception: none Last Pap: 2021. Results were: normal per pt Last mammogram: n/a.   Obstetric History OB History  Gravida Para Term Preterm AB Living  1 1 1     1   SAB IAB Ectopic Multiple Live Births          1    # Outcome Date GA Lbr Len/2nd Weight Sex Delivery Anes PTL Lv  1 Term 07/15/13 [redacted]w[redacted]d 06:29 / 02:58 8 lb 7 oz (3.827 kg) M Vag-Spont EPI  LIV     Birth Comments: caput     The following portions of the patient's history were reviewed and updated as appropriate: allergies, current medications, past family history, past medical history, past social history, past surgical history and problem list.  Review of Systems Pertinent items noted in HPI and remainder of comprehensive ROS otherwise negative.    Objective:   BP 117/77   Pulse 100   Ht 5\' 7"  (1.702 m)   Wt 233 lb (105.7 kg)   BMI 36.49 kg/m  VS reviewed, nursing note reviewed,  Constitutional: well developed, well nourished, no distress HEENT: normocephalic CV: normal rate Pulm/chest wall: normal effort Breast Exam:  Deferred with low risks and shared decision making, discussed recommendation to start mammogram between 40-50 yo/ exam  Abdomen: soft Neuro: alert and oriented x 3 Skin: warm, dry Psych: affect normal Pelvic exam:  Bimanual exam: Cervix 0/long/high, firm, anterior, neg CMT, uterus nontender, nonenlarged, adnexa without  tenderness, enlargement, or mass     Nexplanon Insertion Procedure Patient identified, informed consent performed, consent signed.   Patient does understand that irregular bleeding is a very common side effect of this medication. She was advised to have backup contraception for one week after placement. Pregnancy test in clinic today was negative.  Appropriate time out taken.  Patient's left arm was prepped and draped in the usual sterile fashion.. The ruler used to measure and mark insertion area.  Patient was prepped with alcohol swab and then injected with 3 ml of 1% lidocaine.  She was prepped with betadine, Nexplanon removed from packaging,  Device confirmed in needle, then inserted full length of needle and withdrawn per handbook instructions. Nexplanon was able to palpated in the patient's arm; patient palpated the insert herself. There was minimal blood loss.  Patient insertion site covered with guaze and a pressure bandage to reduce any bruising.  The patient tolerated the procedure well and was given post procedure instructions.       Assessment/Plan:  1. Vaginal discharge  - Cervicovaginal ancillary only( [place])  2. Screening for STD (sexually transmitted disease)  - Hepatitis B surface antigen - Hepatitis C antibody - HIV Antibody (routine testing w rflx) - RPR  3. Well woman exam with routine gynecological exam --Doing well, some occasional cramping, no menses x 1-2 years  4. General counseling and advice for contraceptive management --Discussed  LARCs as most effective forms of birth control.  Discussed benefits/risks of other methods.  Pt desires Nexplanon.   --Nexplanon placed today, see procedure note above --F/U in 3 months with new contraceptive method  5. Secondary amenorrhea -Pt without menses during Depo, last injection 07/2020, still no menses.  Pt hx of heavy regular menses before birth control.   --Pt starting Nexplanon today for contraception. Discussed  option of treating amenorrhea with progesterone challenge test now vs starting birth control. Pt does NOT desire pregnancy right now so wants contraception. --Will f/u in office in 3 months  6. Lower abdominal pain --Exam today wnl, may be related to amenorrhea/oligomenorrhea --Will f/u in 3 months  Sharen Counter, CNM 11:09 AM

## 2020-12-21 NOTE — Patient Instructions (Signed)
Nexplanon Instructions After Insertion   Keep bandage clean and dry for 24 hours   May use ice/Tylenol/Ibuprofen for soreness or pain   If you develop fever, drainage or increased warmth from incision site-contact office immediately   Secondary Amenorrhea  Secondary amenorrhea occurs when a female who was previously having menstrual periods has not had them for 3-6 months. A menstrual period is the monthly shedding of the lining of the uterus. The lining of the uterus is made up of blood, tissue, fluid, and mucus. The flow of blood usually occurs during 3-7 consecutive days each month. This condition has many causes. In many cases, treating the underlying cause will return menstrual periods back to a normal cycle. What are the causes? The most common cause of this condition is pregnancy. Other medical conditions that can cause secondary amenorrhea include:  Cirrhosis of the liver.  Conditions of the blood.  Diabetes.  Epilepsy.  Chronic kidney disease.  Polycystic ovary disease.  A hormonal imbalance.  Ovarian failure.  Cystic fibrosis.  Early menopause.  Cushing syndrome.  Thyroid problems. Other causes may include:  Malnutrition.  Stress or anxiety.  Medicines.  Extreme obesity.  Low body weight or drastic weight loss.  Removal of the ovaries or uterus.  Contraceptive pills, patches, or vaginal rings. What increases the risk? You are more likely to develop this condition if:  You have a family history of this condition.  You have an eating disorder.  You do extreme athletic training.  You have a chronic disease.  You abuse substances such as alcohol or cigarettes. What are the signs or symptoms? The main symptom of this condition is a lack of menstrual periods for 3-6 months in a female who previously had menstrual periods. How is this diagnosed? This condition may be diagnosed based on:  Your medical history.  A physical exam.  A pelvic  exam to check for problems with your reproductive organs.  A procedure to examine the uterus.  A measurement of your body mass index (BMI). You may also have other tests, including:  Blood tests that measure certain hormones in your body and rule out pregnancy.  Urine tests.  Imaging tests, such as an ultrasound, CT scan, or MRI. How is this treated? Treatment for this condition depends on the cause of the amenorrhea. It may involve:  Correcting diet-related problems.  Treating underlying conditions.  Medicines.  Lifestyle changes.  Surgery. If the condition cannot be corrected, it is sometimes possible to start menstrual periods with medicines. Follow these instructions at home: Lifestyle  Maintain a healthy diet. In general, a healthy diet includes lots of fruits and vegetables, low-fat dairy products, lean meats, and foods that contain fiber. Ask to meet with a registered dietitian for nutrition counseling and meal planning.  Maintain a healthy weight. Talk to your health care provider before trying any new diet or exercise plan.  Exercise at least 30 minutes 5 or more days each week. Exercising includes brisk walking, yard work, biking, running, swimming, and team sports like basketball and soccer. Ask your health care provider which exercises are safe for you.  Get enough sleep. Plan your sleep time to allow for 7-9 hours of sleep each night.  Learn to manage stress. Explore relaxation techniques such as meditation, journaling, yoga, or tai chi.      General instructions  Be aware of changes in your menstrual cycle. Keep a record of when you have your menstrual period. Note the date your period starts,  how long it lasts, and any problems you experience.  Take over-the-counter and prescription medicines only as told by your health care provider.  Keep all follow-up visits. This is important. Contact a health care provider if:  Your periods do not return to normal  after treatment. Summary  Secondary amenorrhea is when a female who was previously having menstrual periods has not gotten her period for 3-6 months.  This condition has many causes. In many cases, treating the underlying cause will return menstrual periods back to a normal cycle.  Talk to your health care provider if your periods do not return to normal after treatment. This information is not intended to replace advice given to you by your health care provider. Make sure you discuss any questions you have with your health care provider. Document Revised: 04/12/2020 Document Reviewed: 04/12/2020 Elsevier Patient Education  2021 [employer].

## 2020-12-21 NOTE — Addendum Note (Signed)
Addended by: Kennon Portela on: 12/21/2020 11:47 AM   Modules accepted: Orders

## 2020-12-21 NOTE — Progress Notes (Signed)
NGYN patient presents for Annual Exam  LMP: Has not had a cycle . Last had unprotected intercourse this Tuesday.   Last pap: per pt 2021 at [facility]. No Hx of abnormal paps  Contraception: None at this time recently stopped Depo in 07/2020.  STD Screening: Full Panel and wants screening for BV and yeast for Vaginal discharge pt made aware ins may not cover 100% .  Family Hx of Breast Cancer: None   CC: Abdominal pain /cramping at random times.

## 2020-12-21 NOTE — Addendum Note (Signed)
Addended by: Kennon Portela on: 12/21/2020 02:01 PM   Modules accepted: Orders

## 2020-12-22 LAB — HEPATITIS C ANTIBODY: Hep C Virus Ab: <0.1 s/co ratio (ref 0.0–0.9)

## 2020-12-22 LAB — RPR: RPR Ser Ql: NONREACTIVE

## 2020-12-22 LAB — HEPATITIS B SURFACE ANTIGEN: Hepatitis B Surface Ag: NEGATIVE

## 2020-12-22 LAB — HIV ANTIBODY (ROUTINE TESTING W REFLEX): HIV Screen 4th Generation wRfx: NONREACTIVE

## 2021-01-09 ENCOUNTER — Encounter: Payer: Self-pay | Admitting: Internal Medicine

## 2021-01-09 ENCOUNTER — Ambulatory Visit: Payer: Self-pay

## 2021-01-09 ENCOUNTER — Other Ambulatory Visit: Payer: Self-pay

## 2021-01-09 ENCOUNTER — Ambulatory Visit: Payer: BC Managed Care – PPO | Admitting: Internal Medicine

## 2021-01-09 VITALS — BP 113/78 | HR 103 | Ht 67.25 in | Wt 229.2 lb

## 2021-01-09 DIAGNOSIS — Z8739 Personal history of other diseases of the musculoskeletal system and connective tissue: Secondary | ICD-10-CM | POA: Diagnosis not present

## 2021-01-09 DIAGNOSIS — M545 Low back pain, unspecified: Secondary | ICD-10-CM | POA: Insufficient documentation

## 2021-01-09 DIAGNOSIS — M5459 Other low back pain: Secondary | ICD-10-CM

## 2021-01-09 DIAGNOSIS — G8929 Other chronic pain: Secondary | ICD-10-CM | POA: Diagnosis not present

## 2021-01-09 NOTE — Progress Notes (Signed)
Office Visit Note  Patient: Michele Gibson             Date of Birth: [birth year]           MRN: [id number]             PCP: Penelope Galas, MD Referring: Penelope Galas, MD Visit Date: 01/09/2021 Occupation: Nurse, learning disability  Subjective:  New Patient (Initial Visit) (Hx of RA. Patient has never been on treatment and is not currently on treatment. Patient complains of generalized joint pain, specifically right hand, low back, and bilateral lower extremity pain. )   History of Present Illness: Michele Gibson is a 25 y.o. female here for evaluation of joint pains and history of rheumatoid arthritis. She was previously seen by rheumatology in 2018 for this problem but not on any treatments currently.  She describes joint pains that have been ongoing since about 5 years ago.  She initially attributed this to her workplace however she changed jobs 2 years ago now working as an Presenter, broadcasting and primarily a forklift operation with continuing or may be worsening of joint pain.  She had evaluation in 2018 with rheumatology she feels like they did not have much concern about the joint pains and definitely never started any rheumatoid arthritis treatment plan.  She has had some episodes of chest pain previously evaluated related with x-ray and what sounds like a working diagnosis of costochondritis or muscle strain was made.  She currently describes joint pain worst between the first and second fingers of the right hand that worsens with prolonged use.  There is not any associated numbness swelling or discoloration.  She also has low back pain and pain going down the legs sometimes with numbness and tingling distal to her knees and occasionally her feeling of whole legs going to sleep or becoming numb. She takes Tylenol arthritis as needed this is a moderate improvement to her joint pain. She has noticed a mild amount of lower extremity swelling intermittently since her pregnancy 7 years ago.   She denies any history of scleritis or uveitis.  Activities of Daily Living:  Patient reports morning stiffness for 1-5 minutes.   Patient Reports nocturnal pain.  Difficulty dressing/grooming: Denies Difficulty climbing stairs: Denies Difficulty getting out of chair: Denies Difficulty using hands for taps, buttons, cutlery, and/or writing: Reports  Review of Systems  Constitutional: Positive for fatigue.  HENT: Negative for mouth sores, mouth dryness and nose dryness.   Eyes: Positive for visual disturbance. Negative for pain, itching and dryness.  Respiratory: Negative for cough, hemoptysis, shortness of breath and difficulty breathing.   Cardiovascular: Positive for chest pain. Negative for palpitations and swelling in legs/feet.  Gastrointestinal: Positive for abdominal pain. Negative for blood in stool, constipation and diarrhea.  Endocrine: Negative for increased urination.  Genitourinary: Negative for painful urination.  Musculoskeletal: Positive for arthralgias, joint pain and morning stiffness. Negative for joint swelling, myalgias, muscle weakness, muscle tenderness and myalgias.  Skin: Negative for color change, rash and redness.  Allergic/Immunologic: Negative for susceptible to infections.  Neurological: Positive for headaches. Negative for dizziness, numbness, memory loss and weakness.  Hematological: Negative for swollen glands.  Psychiatric/Behavioral: Positive for sleep disturbance. Negative for confusion.    PMFS History:  Patient Active Problem List   Diagnosis Date Noted  . Low back pain 01/09/2021  . Nexplanon in place 12/21/2020  . Secondary amenorrhea 12/21/2020  . Gastritis 11/13/2020  . Migraine 11/05/2020  . History  of rheumatoid arthritis 08/24/2019  . 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 GERD (gastroesophageal reflux disease)   . Headache(784.0)    migraines dx about 2 years ago  . HSV infection   . Late prenatal  care   . Migraine   . No pertinent past medical history   . Rheumatoid arthritis (HCC)     Family History  Problem Relation Age of Onset  . Hypertension Mother   . Anemia Mother   . Migraines Mother   . Lung cancer Mother   . Diabetes Father   . Deep vein thrombosis Maternal Grandmother   . Hypertension Maternal Grandmother   . Stroke Maternal Grandmother   . Cancer Maternal Grandmother   . Heart attack Paternal Grandmother   . Hypertension Paternal Grandmother   . Diabetes Paternal Grandmother   . Gout Paternal Grandmother   . Stomach cancer Paternal Uncle   . Healthy Son    Past Surgical History:  Procedure Laterality Date  . NO PAST SURGERIES     Social History   Social History Narrative  . Not on file   Immunization History  Administered Date(s) Administered  . Influenza,inj,quad, With Preservative 11/13/2020  . PFIZER(Purple Top)SARS-COV-2 Vaccination 01/05/2020, 09/22/2020  . Tdap 11/13/2020     Objective: Vital Signs: BP 113/78 (BP Location: Right Arm, Patient Position: Sitting, Cuff Size: Normal)   Pulse (!) 103   Ht 5' 7.25" (1.708 m)   Wt 229 lb 3.2 oz (104 kg)   BMI 35.63 kg/m    Physical Exam Constitutional:      Appearance: She is obese.  HENT:     Mouth/Throat:     Mouth: Mucous membranes are moist.     Pharynx: Oropharynx is clear.  Eyes:     Conjunctiva/sclera: Conjunctivae normal.  Cardiovascular:     Rate and Rhythm: Regular rhythm. Tachycardia present.  Pulmonary:     Effort: Pulmonary effort is normal.     Breath sounds: Normal breath sounds.  Skin:    General: Skin is warm and dry.     Findings: No rash.  Neurological:     General: No focal deficit present.     Mental Status: She is alert.  Psychiatric:        Mood and Affect: Mood normal.     Musculoskeletal Exam:  Neck full ROM no tenderness Shoulders full ROM no tenderness or swelling Elbows full ROM no tenderness or swelling Wrists full ROM no tenderness or  swelling Fingers full ROM no tenderness or swelling Bilateral paraspinal muscle tenderness around lumbar spine extending over sacrum and above iliac crest Hip normal internal and external rotation without pain, moderate lateral hip tenderness to palpation on both sides Knees full ROM tenderness just proximal to the lateral and medial joint line worse on the right knee without significant patellofemoral crepitus and no effusion present Ankles full ROM no tenderness or swelling MTPs full ROM no tenderness or swelling  Investigation: No additional findings.  Imaging: XR Lumbar Spine 2-3 Views  Result Date: 01/09/2021 X-ray lumbar spine 2 views Normal appearance of vertebral bodies possible very early anterior endplate changes at L2.  Disc spaces are well-preserved throughout.  No significant scoliosis.  Supernumerary rib on left side.  There is flattening of the normal lumbar lordosis. Impression Flattening of normal lordosis suggestive for muscle spasticity otherwise no significant vertebral body or disc disease seen.  XR Pelvis 1-2 Views  Result Date: 01/09/2021 X-ray pelvis  2 views AP and Ferguson Femoral acetabular joints appear intact bilaterally with no significant arthritic changes.  SI joints are patent bilaterally with no erosion or effusion and only mild sclerosis.  Bone mineralization appears normal. Impression Normal appearing x-ray   Recent Labs: Lab Results  Component Value Date   WBC 9.1 11/22/2020   HGB 13.3 11/22/2020   PLT 274 11/22/2020   NA 139 11/22/2020   K 4.2 11/22/2020   CL 107 11/22/2020   CO2 25 11/22/2020   GLUCOSE 88 11/22/2020   BUN 10 11/22/2020   CREATININE 0.79 11/22/2020   BILITOT 0.6 11/22/2020   ALKPHOS 81 11/22/2020   AST 27 11/22/2020   ALT 52 (H) 11/22/2020   PROT 7.5 11/22/2020   ALBUMIN 4.0 11/22/2020   CALCIUM 9.2 11/22/2020   GFRAA >60 06/04/2017    Speciality Comments: No specialty comments available.  Procedures:  No procedures  performed Allergies: Penicillins   Assessment / Plan:     Visit Diagnoses: History of rheumatoid arthritis  Patient reports history of rheumatoid arthritis based on previous evaluation with rheumatology in 2018 is not clear that inflammatory disease was demonstrated.  No current inflammatory joint changes are seen on exam at this time so not recommending additional work-up or treatments currently.  Chronic bilateral low back pain, unspecified whether sciatica present - Plan: XR Pelvis 1-2 Views, XR Lumbar Spine 2-3 Views, Ambulatory referral to Physical Therapy  Chronic low back pain for the past few years worsening recently seems to be exacerbated with machinery operation and prolonged working not suggestive for inflammatory joint disease.  Lumbar spine and pelvis x-rays obtained in clinic were reviewed with the patient demonstrating loss of the normal lumbar lordosis suggesting paraspinal muscle spasm.  No significant degenerative or inflammatory arthritis changes are demonstrated.  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  Orders: Orders Placed This Encounter  Procedures  . XR Pelvis 1-2 Views  . XR Lumbar Spine 2-3 Views  . Ambulatory referral to Physical Therapy   No orders of the defined types were placed in this encounter.    Follow-Up Instructions: Return if symptoms worsen or fail to improve.   Fuller Plan, MD  Note - This record has been created using [employer].  Chart creation errors have been sought, but may not always  have been located. Such creation errors do not reflect on  the standard of medical care.

## 2021-01-09 NOTE — Patient Instructions (Signed)
Lumbosacral Strain Lumbosacral strain is an injury that causes pain in the lower back (lumbosacral spine). This injury usually happens from overstretching the muscles or ligaments along your spine. Ligaments are cord-like tissues that connect bones to other bones. A strain can affect one or more muscles or ligaments. What are the causes? This condition may be caused by:  A hard, direct hit to the back.  Overstretching the lower back muscles. This may result from: ? A fall. ? Lifting something heavy. ? Repetitive movements such as bending or crouching. What increases the risk? The following factors may make you more likely to develop this condition:  Participating in sports or activities that involve: ? A sudden twist of the back. ? Pushing or pulling motions.  Being overweight or obese.  Having poor strength and flexibility, especially tight hamstrings or weak muscles in the back or abdomen.  Having too much of a curve in the lower back.  Having a pelvis that is tilted forward. What are the signs or symptoms? The main symptom of this condition is pain in the lower back, at the site of the strain. Pain may also be felt down one or both legs. How is this diagnosed? This condition is diagnosed based on your symptoms, your medical history, and a physical exam. During the physical exam, your health care provider may push on certain areas of your back to find the source of your pain. You may be asked to bend forward, backward, and side to side to check your pain and range of motion. You may also have imaging tests, such as X-rays and an MRI. How is this treated? This condition may be treated by:  Applying heat and cold on the affected area.  Taking medicines to help relieve pain and relax your muscles.  Taking NSAIDs, such as ibuprofen, to help reduce swelling and discomfort.  Doing stretching and strengthening exercises for your lower back. Symptoms usually improve within several  weeks of treatment. However, recovery time varies. When your symptoms improve, gradually return to your normal routine as soon as possible to reduce pain, avoid stiffness, and keep muscle strength. Follow these instructions at home: Medicines  Take over-the-counter and prescription medicines only as told by your health care provider.  Ask your health care provider if the medicine prescribed to you: ? Requires you to avoid driving or using heavy machinery. ? Can cause constipation. You may need to take these actions to prevent or treat constipation:  Drink enough fluid to keep your urine pale yellow.  Take over-the-counter or prescription medicines.  Eat foods that are high in fiber, such as beans, whole grains, and fresh fruits and vegetables.  Limit foods that are high in fat and processed sugars, such as fried or sweet foods. Managing pain, stiffness, and swelling  If directed, put ice on the injured area. To do this: ? Put ice in a plastic bag. ? Place a towel between your skin and the bag. ? Leave the ice on for 20 minutes, 2-3 times a day.  If directed, apply heat on the affected area as often as told by your health care provider. Use the heat source that your health care provider recommends, such as a moist heat pack or a heating pad. ? Place a towel between your skin and the heat source. ? Leave the heat on for 20-30 minutes. ? Remove the heat if your skin turns bright red. This is especially important if you are unable to feel pain, heat, or   cold. You may have a greater risk of getting burned.      Activity  Rest as told by your health care provider.  Do not stay in bed. Staying in bed for more than 1-2 days can delay your recovery.  Return to your normal activities as told by your health care provider. Ask your health care provider what activities are safe for you.  Avoid activities that take a lot of energy for as long as told by your health care provider.  Do  exercises as told by your health care provider. This includes stretching and strengthening exercises. General instructions  Sit up and stand up straight. Avoid leaning forward when you sit, or hunching over when you stand.  Do not use any products that contain nicotine or tobacco, such as cigarettes, e-cigarettes, and chewing tobacco. If you need help quitting, ask your health care provider.  Keep all follow-up visits as told by your health care provider. This is important. How is this prevented?  Use correct form when playing sports and lifting heavy objects.  Use good posture when sitting and standing.  Maintain a healthy weight.  Sleep on a mattress with medium firmness to support your back.  Do at least 150 minutes of moderate-intensity exercise each week, such as brisk walking or water aerobics. Try a form of exercise that takes stress off your back, such as swimming or stationary cycling.  Maintain physical fitness, including: ? Strength. ? Flexibility.   Contact a health care provider if:  Your back pain does not improve after several weeks of treatment.  Your symptoms get worse. Get help right away if:  Your back pain is severe.  You cannot stand or walk.  You have difficulty controlling when you urinate or when you have a bowel movement.  You feel nauseous or you vomit.  Your feet or legs get very cold, turn pale, or look blue.  You have numbness, tingling, weakness, or problems using your arms or legs.  You develop any of the following: ? Shortness of breath. ? Dizziness. ? Pain in your legs. ? Weakness in your buttocks or legs. Summary  Lumbosacral strain is an injury that causes pain in the lower back (lumbosacral spine).  This injury usually happens from overstretching the muscles or ligaments along your spine.  This condition may be caused by a direct hit to the lower back or by overstretching the lower back muscles.  Symptoms usually improve  within several weeks of treatment. This information is not intended to replace advice given to you by your health care provider. Make sure you discuss any questions you have with your health care provider. Document Revised: 01/19/2019 Document Reviewed: 01/19/2019 [employer] Patient Education  2021 [employer]  

## 2021-02-01 ENCOUNTER — Other Ambulatory Visit: Payer: Self-pay

## 2021-02-01 ENCOUNTER — Ambulatory Visit: Payer: BC Managed Care – PPO | Attending: Internal Medicine | Admitting: Physical Therapy

## 2021-02-01 DIAGNOSIS — M25651 Stiffness of right hip, not elsewhere classified: Secondary | ICD-10-CM | POA: Insufficient documentation

## 2021-02-01 DIAGNOSIS — M5441 Lumbago with sciatica, right side: Secondary | ICD-10-CM | POA: Diagnosis present

## 2021-02-01 DIAGNOSIS — M6281 Muscle weakness (generalized): Secondary | ICD-10-CM | POA: Diagnosis present

## 2021-02-01 DIAGNOSIS — M25652 Stiffness of left hip, not elsewhere classified: Secondary | ICD-10-CM | POA: Insufficient documentation

## 2021-02-01 DIAGNOSIS — G8929 Other chronic pain: Secondary | ICD-10-CM | POA: Diagnosis present

## 2021-02-01 DIAGNOSIS — M5442 Lumbago with sciatica, left side: Secondary | ICD-10-CM | POA: Diagnosis present

## 2021-02-01 DIAGNOSIS — R293 Abnormal posture: Secondary | ICD-10-CM | POA: Insufficient documentation

## 2021-02-01 NOTE — Therapy (Signed)
[facility] Outpatient Rehabilitation [facility] [address] [place], [place], [address] Phone: [phone]   Fax:  [phone]  Physical Therapy Evaluation  Patient Details  Name: Michele Gibson MRN: [id number] Date of Birth: August 06, 1996 Referring Provider (PT): Fuller Plan, MD   Encounter Date: 02/01/2021   PT End of Session - 02/01/21 0754    Visit Number 1    Number of Visits 12    Date for PT Re-Evaluation 03/15/21    Authorization Type BCBS    PT Start Time 0800    PT Stop Time 0845    PT Time Calculation (min) 45 min    Activity Tolerance Patient tolerated treatment well    Behavior During Therapy [facility] for tasks assessed/performed           Past Medical History:  Diagnosis Date  . GERD (gastroesophageal reflux disease)   . Headache(784.0)    migraines dx about 2 years ago  . HSV infection   . Late prenatal care   . Migraine   . No pertinent past medical history   . Rheumatoid arthritis [facility])     Past Surgical History:  Procedure Laterality Date  . NO PAST SURGERIES      There were no vitals filed for this visit.    Subjective Assessment - 02/01/21 0800    Subjective Pt reports mid back all the way to her legs is sharp pains. Her legs will go numb. 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 Pt reports increased stiffness by the end of the day. This has been occuring for years (~2017). Pt states it slowly built and progressively gotten worse. Pt states it will happen 3-4 times during night shift. Pt states it starts to hurt her hips and she can't lay on her left side. Pt reports it can sometimes wake her up at night an takes tylenol before bed.    Pertinent History GERD, HA, migraine, RA    Limitations Lifting;Standing;Sitting    How long can you sit comfortably? varies; can be a few minutes, sometimes hours    How long can you stand comfortably? Immediately    How long can you walk comfortably? This is no  issues for the most part    Patient Stated Goals Decrease pain to be able to work    Currently in Pain? Yes    Pain Score 7     Pain Location Back    Pain Orientation Mid;Left    Pain Descriptors / Indicators Burning;Sharp    Pain Type Chronic pain    Pain Radiating Towards Goes down to hips and legs    Pain Onset More than a month ago    Pain Frequency Intermittent    Pain Relieving Factors Biofreeze, tylenol, epson baths              [facility] PT Assessment - 02/01/21 0001      Assessment   Medical Diagnosis M54.50,G89.29 (ICD-10-CM) - Chronic bilateral low back pain, unspecified whether sciatica present    Referring Provider (PT) Fuller Plan, MD    Onset Date/Surgical Date --   2017   Hand Dominance Left    Prior Therapy None      Precautions   Precautions None      Restrictions   Weight Bearing Restrictions No      Balance Screen   Has the patient fallen in the past 6 months No      Home  Tourist information centre manager residence    Living Arrangements Children;Spouse/significant other    Available Help at Discharge Family    Type of Home Apartment    Home Access Stairs to enter    Entrance Stairs-Number of Steps --   at least 12     Prior Function   Vocation Full time employment    Vocation Requirements Heavy power equipment      Observation/Other Assessments   Focus on Therapeutic Outcomes (FOTO)  54; predicted 68      Other:   Other/ Comments Plank 25 sec forearm & knees      Posture/Postural Control   Posture Comments SLS L: 25 sec; SLS R: 36 sec      ROM / Strength   AROM / PROM / Strength Strength;AROM      AROM   Overall AROM Comments Lumbar AROM with no restrictions -- pain at end ranges (noted L side flexion and R rotation worst)    AROM Assessment Site Hip      Strength   Strength Assessment Site Hip;Knee    Right/Left Hip Right;Left    Right Hip Flexion 5/5    Right Hip Extension 4-/5    Right Hip ABduction 4-/5    Left  Hip Flexion 5/5    Left Hip Extension 5/5    Left Hip ABduction 5/5    Right/Left Knee Right;Left    Right Knee Flexion 4+/5    Right Knee Extension 5/5    Left Knee Flexion 4-/5    Left Knee Extension 4+/5      Flexibility   Soft Tissue Assessment /Muscle Length yes   Hip flexors in thomas test grossly 30 deg above table     Palpation   Spinal mobility hypermobile throughout    Palpation comment TTP bilat lumbar paraspinals      Special Tests    Special Tests Lumbar    Other special tests [place]: (+) Bilat (potentially due to tight hip flexors)    Lumbar Tests Slump Test;Straight Leg Raise      Slump test   Findings Positive    Comment Bilat radiation to feet      Straight Leg Raise   Findings Positive    Comment Bilat at ~60 deg; feels in back of leg and into back of hip                      Objective measurements completed on examination: See above findings.               PT Education - 02/01/21 1025    Education Details Discussed exam findings, POC, HEP    Person(s) Educated Patient    Methods Explanation;Demonstration;Verbal cues;Handout    Comprehension Verbalized understanding;Returned demonstration;Tactile cues required            PT Short Term Goals - 02/01/21 1035      PT SHORT TERM GOAL #1   Title STGs = LTGs             PT Long Term Goals - 02/01/21 1035      PT LONG TERM GOAL #1   Title Pt will be independent with initial HEP    Time 6    Period Weeks    Status New    Target Date 03/15/21      PT LONG TERM GOAL #2   Title Pt will demo improved core strength by planking on forearms and toes  x 30 sec    Baseline Tolerated 23 sec on forearm and knees    Time 6    Period Weeks    Status New    Target Date 03/15/21      PT LONG TERM GOAL #3   Title Pt will report at least 50% decrease in N/T with work tasks    Time 6    Period Weeks    Status New    Target Date 03/15/21      PT LONG TERM GOAL #4   Title Pt  will increase FOTO to at least 68    Baseline 54 on eval    Time 6    Period Weeks    Status New    Target Date 03/15/21                  Plan - 02/01/21 2951    Clinical Impression Statement Ms. Michele Gibson is a 25 y/o F presenting to OPPT due to progressively worsening back pain since ~2017 with intermittent N/T in bilat LEs (on presentation pt reports more on L LE today). Pain worsens in static positions (especially standing). 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 Pt's posture significant for anterior pelvic tilt with increased upper lumbar lordosis. On palpation, pt with TTP thoracolumbar paraspinals. Pt demos weak trunk/core with hypermobile lumbar spine and bilat hip flexor and piriformis tightness (L tighter than R). Pt's s/s consistent with sciatica due to lumbar spinal instability and potential nerve entrapment.    Personal Factors and Comorbidities Comorbidity 1;Age;Fitness;Profession;Time since onset of injury/illness/exacerbation;Past/Current Experience    Comorbidities GERD, HA, migraine, RA    Examination-Activity Limitations Locomotion Level;Squat;Stairs;Lift;Sit;Bend;Stand    Examination-Participation Restrictions Occupation;Community Activity;Cleaning;Yard Work    Stability/Clinical Decision Making Stable/Uncomplicated    Optometrist Low    Rehab Potential Good    PT Frequency 2x / week    PT Duration 6 weeks    PT Treatment/Interventions ADLs/Self Care Home Management;Aquatic Therapy;Cryotherapy;Electrical Stimulation;Iontophoresis 4mg /ml Dexamethasone;Moist Heat;Gait training;Stair training;Functional mobility training;Therapeutic activities;Therapeutic exercise;Balance training;Neuromuscular re-education;Manual techniques;Patient/family education;Dry needling;Taping;Vasopneumatic Device;Spinal Manipulations;Joint Manipulations;Passive range of motion    PT Next Visit Plan Assess response to HEP. May need SI assessment. Continue to strengthen core and  pelvis. Stretch hip flexors and piriformis. Work on decreasing PPT; discuss standing posture.    PT Home Exercise Plan Access Code [email or web address]    Consulted and Agree with Plan of Care Patient           Patient will benefit from skilled therapeutic intervention in order to improve the following deficits and impairments:  Increased fascial restricitons,Increased muscle spasms,Decreased activity tolerance,Hypermobility,Pain,Hypomobility,Improper body mechanics,Decreased mobility,Decreased strength,Impaired sensation,Postural dysfunction  Visit Diagnosis: Abnormal posture  Muscle weakness (generalized)  Chronic midline low back pain with bilateral sciatica  Stiffness of left hip, not elsewhere classified  Stiffness of right hip, not elsewhere classified     Problem List Patient Active Problem List   Diagnosis Date Noted  . Low back pain 01/09/2021  . Nexplanon in place 12/21/2020  . Secondary amenorrhea 12/21/2020  . Gastritis 11/13/2020  . Migraine 11/05/2020  . History of rheumatoid arthritis 08/24/2019  . 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

## 2021-02-06 ENCOUNTER — Ambulatory Visit: Payer: BC Managed Care – PPO | Admitting: Physical Therapy

## 2021-02-06 ENCOUNTER — Encounter: Payer: Self-pay | Admitting: Physical Therapy

## 2021-02-06 ENCOUNTER — Other Ambulatory Visit: Payer: Self-pay

## 2021-02-06 DIAGNOSIS — M25652 Stiffness of left hip, not elsewhere classified: Secondary | ICD-10-CM

## 2021-02-06 DIAGNOSIS — G8929 Other chronic pain: Secondary | ICD-10-CM

## 2021-02-06 DIAGNOSIS — R293 Abnormal posture: Secondary | ICD-10-CM | POA: Diagnosis not present

## 2021-02-06 DIAGNOSIS — M5442 Lumbago with sciatica, left side: Secondary | ICD-10-CM

## 2021-02-06 DIAGNOSIS — M25651 Stiffness of right hip, not elsewhere classified: Secondary | ICD-10-CM

## 2021-02-06 DIAGNOSIS — M6281 Muscle weakness (generalized): Secondary | ICD-10-CM

## 2021-02-06 NOTE — Therapy (Signed)
[facility] Outpatient Rehabilitation [facility] [address] [place], [place], [address] Phone: [phone]   Fax:  [phone]  Physical Therapy Treatment  Patient Details  Name: Michele Gibson MRN: [id number] Date of Birth: 1996/05/17 Referring Provider (PT): Fuller Plan, MD   Encounter Date: 02/06/2021   PT End of Session - 02/06/21 0932    Visit Number 2    Number of Visits 12    Date for PT Re-Evaluation 03/15/21    Authorization Type BCBS    PT Start Time [phone]    PT Stop Time 1011    PT Time Calculation (min) 40 min    Activity Tolerance Patient tolerated treatment well    Behavior During Therapy [facility] for tasks assessed/performed           Past Medical History:  Diagnosis Date  . GERD (gastroesophageal reflux disease)   . Headache(784.0)    migraines dx about 2 years ago  . HSV infection   . Late prenatal care   . Migraine   . No pertinent past medical history   . Rheumatoid arthritis [facility])     Past Surgical History:  Procedure Laterality Date  . NO PAST SURGERIES      There were no vitals filed for this visit.   Subjective Assessment - 02/06/21 0936    Subjective "I think I am doing good for the most part. last night at work my R leg went numb, and it is still feeling tingly but mostly on the Right side."    Currently in Pain? Yes    Pain Score 6     Pain Location Back    Pain Orientation Left    Pain Descriptors / Indicators Aching    Pain Type Chronic pain    Pain Onset More than a month ago              [facility] PT Assessment - 02/06/21 0001      Assessment   Medical Diagnosis M54.50,G89.29 (ICD-10-CM) - Chronic bilateral low back pain, unspecified whether sciatica present    Referring Provider (PT) Fuller Plan, MD                         [facility] Adult PT Treatment/Exercise - 02/06/21 0001      Exercises   Exercises Lumbar      Lumbar Exercises: Aerobic   Nustep L5 x 5 min Ue/LE      Lumbar  Exercises: Supine   Ab Set 15 reps   x 2 sets   Dead Bug 5 reps   15 sec hold   Dead Bug Limitations maintiaing PPT      Lumbar Exercises: Prone   Other Prone Lumbar Exercises prone on elbos 1 x 15    Other Prone Lumbar Exercises prone press up 2 x 15                  PT Education - 02/06/21 0946    Education Details discogenic biomechanics, reviewed HEP and updated today.    Person(s) Educated Patient    Methods Explanation;Verbal cues;Handout    Comprehension Verbalized understanding;Verbal cues required            PT Short Term Goals - 02/01/21 1035      PT SHORT TERM GOAL #1   Title STGs = LTGs             PT Long Term Goals - 02/01/21 1035  PT LONG TERM GOAL #1   Title Pt will be independent with initial HEP    Time 6    Period Weeks    Status New    Target Date 03/15/21      PT LONG TERM GOAL #2   Title Pt will demo improved core strength by planking on forearms and toes x 30 sec    Baseline Tolerated 23 sec on forearm and knees    Time 6    Period Weeks    Status New    Target Date 03/15/21      PT LONG TERM GOAL #3   Title Pt will report at least 50% decrease in N/T with work tasks    Time 6    Period Weeks    Status New    Target Date 03/15/21      PT LONG TERM GOAL #4   Title Pt will increase FOTO to at least 68    Baseline 54 on eval    Time 6    Period Weeks    Status New    Target Date 03/15/21                 Plan - 02/06/21 1010    Clinical Impression Statement pt reports pain today at 6/10 and notes she has been compliant with her HEP. trialed extension bias with prone on elbows/ press up which she did demonstrate centralization and reported no N/T in the leg and some increase in soreness which discussed this is normal. continued working extnesion bias in supine with bridges and core activation. end of session she noted pain dropped to a 5/10.    PT Treatment/Interventions ADLs/Self Care Home Management;Aquatic  Therapy;Cryotherapy;Electrical Stimulation;Iontophoresis 4mg /ml Dexamethasone;Moist Heat;Gait training;Stair training;Functional mobility training;Therapeutic activities;Therapeutic exercise;Balance training;Neuromuscular re-education;Manual techniques;Patient/family education;Dry needling;Taping;Vasopneumatic Device;Spinal Manipulations;Joint Manipulations;Passive range of motion    PT Next Visit Plan Assess response to HEP. response to extension bias. Continue to strengthen core and pelvis. Stretch hip flexors and piriformis. Work on decreasing PPT; discuss standing posture.    PT Home Exercise Plan Access Code [email or web address]    Consulted and Agree with Plan of Care Patient           Patient will benefit from skilled therapeutic intervention in order to improve the following deficits and impairments:  Increased fascial restricitons,Increased muscle spasms,Decreased activity tolerance,Hypermobility,Pain,Hypomobility,Improper body mechanics,Decreased mobility,Decreased strength,Impaired sensation,Postural dysfunction  Visit Diagnosis: Abnormal posture  Muscle weakness (generalized)  Chronic midline low back pain with bilateral sciatica  Stiffness of left hip, not elsewhere classified  Stiffness of right hip, not elsewhere classified     Problem List Patient Active Problem List   Diagnosis Date Noted  . Low back pain 01/09/2021  . Nexplanon in place 12/21/2020  . Secondary amenorrhea 12/21/2020  . Gastritis 11/13/2020  . Migraine 11/05/2020  . History of rheumatoid arthritis 08/24/2019  . 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 [place], [place], [place] Phone: [phone]   Fax:  [phone]  Name: Michele Gibson MRN: Doree Fudge Date of Birth: [birth year]

## 2021-02-08 ENCOUNTER — Encounter: Payer: BC Managed Care – PPO | Admitting: Physical Therapy

## 2021-02-12 ENCOUNTER — Other Ambulatory Visit: Payer: Self-pay

## 2021-02-12 ENCOUNTER — Ambulatory Visit: Payer: BC Managed Care – PPO | Attending: Internal Medicine | Admitting: Physical Therapy

## 2021-02-12 ENCOUNTER — Encounter: Payer: Self-pay | Admitting: Physical Therapy

## 2021-02-12 DIAGNOSIS — R293 Abnormal posture: Secondary | ICD-10-CM | POA: Insufficient documentation

## 2021-02-12 DIAGNOSIS — M5442 Lumbago with sciatica, left side: Secondary | ICD-10-CM | POA: Insufficient documentation

## 2021-02-12 DIAGNOSIS — M6281 Muscle weakness (generalized): Secondary | ICD-10-CM | POA: Diagnosis present

## 2021-02-12 DIAGNOSIS — M25651 Stiffness of right hip, not elsewhere classified: Secondary | ICD-10-CM | POA: Insufficient documentation

## 2021-02-12 DIAGNOSIS — M25652 Stiffness of left hip, not elsewhere classified: Secondary | ICD-10-CM | POA: Diagnosis present

## 2021-02-12 DIAGNOSIS — M5441 Lumbago with sciatica, right side: Secondary | ICD-10-CM | POA: Diagnosis present

## 2021-02-12 DIAGNOSIS — G8929 Other chronic pain: Secondary | ICD-10-CM | POA: Diagnosis present

## 2021-02-12 NOTE — Patient Instructions (Signed)
Access Code: [email or web address] URL: [email or web address] Date: 02/12/2021 Prepared by: Myrla Halsted  Exercise added PRN for pain management laying on affected side of radicular symptom.   Sidelying Thoracic Rotation with Open Book (Mirrored) - 1 x daily - 7 x weekly - 2 sets - 10 reps

## 2021-02-12 NOTE — Therapy (Addendum)
[place], [place], [address] Phone: [phone]   Fax:  [phone]  Physical Therapy Treatment / Discharge  Patient Details  Name: Michele Gibson MRN: [id number] Date of Birth: 1996/02/28 Referring Provider (PT): Collier Salina, MD   Encounter Date: 02/12/2021   PT End of Session - 02/12/21 0832     Visit Number 3    Number of Visits 12    Date for PT Re-Evaluation 03/15/21    Authorization Type BCBS    PT Start Time 0745    PT Stop Time 0827    PT Time Calculation (min) 42 min    Activity Tolerance Patient tolerated treatment well    Behavior During Therapy [facility] for tasks assessed/performed             Past Medical History:  Diagnosis Date   GERD (gastroesophageal reflux disease)    Headache(784.0)    migraines dx about 2 years ago   HSV infection    Late prenatal care    Migraine    No pertinent past medical history    Rheumatoid arthritis ([place])     Past Surgical History:  Procedure Laterality Date   NO PAST SURGERIES      There were no vitals filed for this visit.   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    Pertinent History GERD, HA, migraine, RA    Limitations Lifting;Standing;Sitting    Currently in Pain? Yes    Pain Score 5     Pain Location Back    Pain Orientation Right    Pain Type Chronic pain    Pain Radiating Towards Goes down the R leg today.                [facility] PT Assessment - 02/12/21 0001       Assessment   Medical Diagnosis M54.50,G89.29 (ICD-10-CM) - Chronic bilateral low back pain, unspecified whether sciatica present    Referring Provider (PT) Collier Salina, MD                           [facility] Adult PT Treatment/Exercise - 02/12/21 0001       Lumbar Exercises:  Aerobic   Nustep L5 x 5 min UE/LE      Lumbar Exercises: Standing   Other Standing Lumbar Exercises On GR ball, march and LAQ x10ea      Lumbar Exercises: Supine   Dead Bug 5 reps;Other (comment)   15sec   Dead Bug Limitations maintiaing PPT    Bridge 10 reps    Bridge Limitations Initiate with Ab Set    Bridge with [employer] 10 reps    Bridge with [employer] Limitations 2 sets    Other Supine Lumbar Exercises Pallof Press BLUE 2x10ea dir      Lumbar Exercises: Sidelying   Clam 10 reps    Clam Limitations 2sets RED    Other Sidelying Lumbar Exercises R side lying Open Book x10      Lumbar Exercises: Prone   Other Prone Lumbar Exercises prone on elbows x57mn    Other Prone Lumbar Exercises prone press up x10  PT Education - 02/12/21 0830     Education Details Reviewed HEP, given alternative positioning for lumbar extension and hip flexor stretch as well as adding open book for radicular management.    Person(s) Educated Patient    Methods Demonstration;Verbal cues;Handout    Comprehension Verbalized understanding;Returned demonstration              PT Short Term Goals - 02/01/21 1035       PT SHORT TERM GOAL #1   Title STGs = LTGs               PT Long Term Goals - 02/01/21 1035       PT LONG TERM GOAL #1   Title Pt will be independent with initial HEP    Time 6    Period Weeks    Status New    Target Date 03/15/21      PT LONG TERM GOAL #2   Title Pt will demo improved core strength by planking on forearms and toes x 30 sec    Baseline Tolerated 23 sec on forearm and knees    Time 6    Period Weeks    Status New    Target Date 03/15/21      PT LONG TERM GOAL #3   Title Pt will report at least 50% decrease in N/T with work tasks    Time 6    Period Weeks    Status New    Target Date 03/15/21      PT LONG TERM GOAL #4   Title Pt will increase FOTO to at least 68    Baseline 54 on eval    Time 6    Period  Weeks    Status New    Target Date 03/15/21                   Plan - 02/12/21 9323     Clinical Impression Statement Patient reports mild decreased pain today at 5/10 compared to last visit, moderately compliant with HEP.  Patient responding well to extension biased exercises, given alternative positioning for use at work throughout the shift.  Patient reports fatigue but no radicular symptoms after treatment.  Patient will benefit from continued skilled PT to address deficits and maximzie working in physical job with less pain and "numbness".    Comorbidities GERD, HA, migraine, RA    Examination-Activity Limitations Locomotion Level;Squat;Stairs;Lift;Sit;Bend;Stand    PT Treatment/Interventions ADLs/Self Care Home Management;Aquatic Therapy;Cryotherapy;Electrical Stimulation;Iontophoresis 24m/ml Dexamethasone;Moist Heat;Gait training;Stair training;Functional mobility training;Therapeutic activities;Therapeutic exercise;Balance training;Neuromuscular re-education;Manual techniques;Patient/family education;Dry needling;Taping;Vasopneumatic Device;Spinal Manipulations;Joint Manipulations;Passive range of motion    PT Next Visit Plan Assess response to HEP. response to extension bias. Continue to strengthen core and pelvis. Stretch hip flexors and piriformis. Work on decreasing PPT; discuss standing posture.    PT Home Exercise Plan Access Code [email or web address]    Consulted and Agree with Plan of Care Patient             Patient will benefit from skilled therapeutic intervention in order to improve the following deficits and impairments:  Increased fascial restricitons,Increased muscle spasms,Decreased activity tolerance,Hypermobility,Pain,Hypomobility,Improper body mechanics,Decreased mobility,Decreased strength,Impaired sensation,Postural dysfunction  Visit Diagnosis: Abnormal posture  Muscle weakness (generalized)  Chronic midline low back pain with bilateral sciatica  Stiffness of  left hip, not elsewhere classified  Stiffness of right hip, not elsewhere classified     Problem List Patient Active Problem List   Diagnosis Date Noted   Low back pain 01/09/2021  Nexplanon in place 12/21/2020   Secondary amenorrhea 12/21/2020   Gastritis 11/13/2020   Migraine 11/05/2020   History of rheumatoid arthritis 08/24/2019   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 [place], [place], [address] Phone: [phone]   Fax:  [phone]  Name: EMERLYN MEHLHOFF MRN: [id number] Date of Birth: [birth year]     PHYSICAL THERAPY DISCHARGE SUMMARY  Visits from Start of Care: 3  Current functional level related to goals / functional outcomes: See goals   Remaining deficits: Current status unknown   Education / Equipment: HEP   Patient agrees to discharge. Patient goals were not met. Patient is being discharged due to not returning since the last visit. Kristoffer Leamon PT, DPT, LAT, ATC  04/12/21  11:17 AM

## 2021-02-14 ENCOUNTER — Ambulatory Visit: Payer: BC Managed Care – PPO | Admitting: Physical Therapy

## 2021-02-14 ENCOUNTER — Telehealth: Payer: Self-pay | Admitting: *Deleted

## 2021-02-14 ENCOUNTER — Encounter: Payer: BC Managed Care – PPO | Admitting: Physical Therapy

## 2021-02-14 NOTE — Telephone Encounter (Signed)
Received vm message from patient stating she had labs done at her PCP office and was told her WBC's are elevated again. TCT patient and spoke with her. Advised her to call her PCP office and have them fax those lab results from 02/12/21 to this office @ [phone]  Pt stated she would call her pcp

## 2021-02-18 ENCOUNTER — Encounter: Payer: Self-pay | Admitting: Physical Therapy

## 2021-02-19 ENCOUNTER — Ambulatory Visit: Payer: BC Managed Care – PPO | Admitting: Physical Therapy

## 2021-02-20 ENCOUNTER — Encounter: Payer: BC Managed Care – PPO | Admitting: Physical Therapy

## 2021-02-21 ENCOUNTER — Ambulatory Visit: Payer: BC Managed Care – PPO | Admitting: Physical Therapy

## 2021-02-26 ENCOUNTER — Ambulatory Visit: Payer: BC Managed Care – PPO | Admitting: Physical Therapy

## 2021-02-28 ENCOUNTER — Ambulatory Visit: Payer: BC Managed Care – PPO | Admitting: Physical Therapy

## 2021-03-05 ENCOUNTER — Ambulatory Visit: Payer: BC Managed Care – PPO | Admitting: Physical Therapy

## 2021-03-07 ENCOUNTER — Ambulatory Visit: Payer: BC Managed Care – PPO

## 2021-03-08 ENCOUNTER — Ambulatory Visit (INDEPENDENT_AMBULATORY_CARE_PROVIDER_SITE_OTHER): Payer: BC Managed Care – PPO | Admitting: Advanced Practice Midwife

## 2021-03-08 ENCOUNTER — Other Ambulatory Visit: Payer: Self-pay

## 2021-03-08 VITALS — BP 131/80 | HR 88 | Wt 222.0 lb

## 2021-03-08 DIAGNOSIS — Z3046 Encounter for surveillance of implantable subdermal contraceptive: Secondary | ICD-10-CM | POA: Diagnosis not present

## 2021-03-08 DIAGNOSIS — Z975 Presence of (intrauterine) contraceptive device: Secondary | ICD-10-CM

## 2021-03-08 DIAGNOSIS — N911 Secondary amenorrhea: Secondary | ICD-10-CM

## 2021-03-08 NOTE — Progress Notes (Signed)
GYN presents for [facility] follow up. Nexplanon (LA) was inserted 1 month ago.

## 2021-03-08 NOTE — Progress Notes (Signed)
  GYNECOLOGY PROGRESS NOTE  History:  25 y.o. G1P1001 presents to [facility] office today for follow up gyn visit. She was seen 12/21/20 for contraception and secondary amenorrhea.  She had Nexplanon placement at that visit. She is happy with Nexplanon, continues to be amenorrheic, and denies any other gyn concerns.  Her last Pap was in 2021 at [facility] and was normal per pt.    The following portions of the patient's history were reviewed and updated as appropriate: allergies, current medications, past family history, past medical history, past social history, past surgical history and problem list.  Records requested for Pap in 2021.  Health Maintenance Due  Topic Date Due   Pneumococcal Vaccine 26-6 Years old (1 - PCV) Never done   HPV VACCINES (1 - 2-dose series) Never done   PAP-Cervical Cytology Screening  Never done   PAP SMEAR-Modifier  Never done   COVID-19 Vaccine (3 - Pfizer risk series) 10/20/2020     Review of Systems:  Pertinent items are noted in HPI.   Objective:  Physical Exam Blood pressure 131/80, pulse 88, weight 222 lb (100.7 kg). VS reviewed, nursing note reviewed,  Constitutional: well developed, well nourished, no distress HEENT: normocephalic CV: normal rate Pulm/chest wall: normal effort Breast Exam: deferred Abdomen: soft Neuro: alert and oriented x 3 Skin: warm, dry Psych: affect normal Pelvic exam: Deferred  Assessment & Plan:  1. Secondary amenorrhea --Uterine stabilization with Nexplanon.  Pt Ok with amenorrhea, does not plan pregnancy any time soon. --F/U in 1 year --Pap records requested  2. Nexplanon in place   [employer], CNM 8:25 AM

## 2021-03-13 ENCOUNTER — Encounter: Payer: BC Managed Care – PPO | Admitting: Physical Therapy

## 2021-03-15 ENCOUNTER — Ambulatory Visit: Payer: BC Managed Care – PPO

## 2021-05-25 ENCOUNTER — Inpatient Hospital Stay: Payer: BC Managed Care – PPO | Attending: Hematology and Oncology | Admitting: Hematology and Oncology

## 2021-05-25 ENCOUNTER — Encounter: Payer: Self-pay | Admitting: Hematology and Oncology

## 2021-05-25 ENCOUNTER — Inpatient Hospital Stay: Payer: BC Managed Care – PPO

## 2021-05-25 ENCOUNTER — Other Ambulatory Visit: Payer: Self-pay

## 2021-05-25 ENCOUNTER — Other Ambulatory Visit: Payer: Self-pay | Admitting: Hematology and Oncology

## 2021-05-25 VITALS — BP 104/71 | HR 79 | Temp 98.2°F | Resp 17 | Ht 67.25 in | Wt 211.6 lb

## 2021-05-25 DIAGNOSIS — D72825 Bandemia: Secondary | ICD-10-CM

## 2021-05-25 DIAGNOSIS — D72829 Elevated white blood cell count, unspecified: Secondary | ICD-10-CM | POA: Insufficient documentation

## 2021-05-25 LAB — CBC WITH DIFFERENTIAL (CANCER CENTER ONLY)
Abs Immature Granulocytes: 0.02 10*3/uL (ref 0.00–0.07)
Basophils Absolute: 0 10*3/uL (ref 0.0–0.1)
Basophils Relative: 0 %
Eosinophils Absolute: 0.1 10*3/uL (ref 0.0–0.5)
Eosinophils Relative: 1 %
HCT: 39.3 % (ref 36.0–46.0)
Hemoglobin: 13.3 g/dL (ref 12.0–15.0)
Immature Granulocytes: 0 %
Lymphocytes Relative: 34 %
Lymphs Abs: 3 10*3/uL (ref 0.7–4.0)
MCH: 30.3 pg (ref 26.0–34.0)
MCHC: 33.8 g/dL (ref 30.0–36.0)
MCV: 89.5 fL (ref 80.0–100.0)
Monocytes Absolute: 0.5 10*3/uL (ref 0.1–1.0)
Monocytes Relative: 6 %
Neutro Abs: 5.1 10*3/uL (ref 1.7–7.7)
Neutrophils Relative %: 59 %
Platelet Count: 249 10*3/uL (ref 150–400)
RBC: 4.39 MIL/uL (ref 3.87–5.11)
RDW: 12.7 % (ref 11.5–15.5)
WBC Count: 8.8 10*3/uL (ref 4.0–10.5)
nRBC: 0 % (ref 0.0–0.2)

## 2021-05-25 LAB — CMP (CANCER CENTER ONLY)
ALT: 21 U/L (ref 0–44)
AST: 14 U/L — ABNORMAL LOW (ref 15–41)
Albumin: 3.8 g/dL (ref 3.5–5.0)
Alkaline Phosphatase: 83 U/L (ref 38–126)
Anion gap: 9 (ref 5–15)
BUN: 12 mg/dL (ref 6–20)
CO2: 20 mmol/L — ABNORMAL LOW (ref 22–32)
Calcium: 9.4 mg/dL (ref 8.9–10.3)
Chloride: 112 mmol/L — ABNORMAL HIGH (ref 98–111)
Creatinine: 0.77 mg/dL (ref 0.44–1.00)
GFR, Estimated: >60 mL/min (ref >60)
Glucose, Bld: 92 mg/dL (ref 70–99)
Potassium: 4.1 mmol/L (ref 3.5–5.1)
Sodium: 141 mmol/L (ref 135–145)
Total Bilirubin: 0.2 mg/dL — ABNORMAL LOW (ref 0.3–1.2)
Total Protein: 7.2 g/dL (ref 6.5–8.1)

## 2021-05-25 LAB — LACTATE DEHYDROGENASE: LDH: 199 U/L — ABNORMAL HIGH (ref 98–192)

## 2021-05-25 NOTE — Progress Notes (Signed)
[facility] [phone]   [phone]  PROGRESS NOTE  Patient Care Team: Penelope Galas, MD as PCP - General (Internal Medicine)  Hematological/Oncological History # Leukocytosis 1) 10/26/2020: WBC 11.0 (nml 3.8-10.8), Hgb 13.6, MCV 89.7, Plt 280. Neutrophilic predominance 2) 11/22/2020: establish care with Dr. Leonides Schanz 3) 05/25/2021: WBC 8.8, Hgb 13.3, MCV 89.5, Plt 249  Interval History:  Michele Gibson 25 y.o. female with medical history significant for mild leukocytosis who presents for a follow up visit. The patient's last visit was on 11/22/2020. In the interim since the last visit her leukocytosis has resolved.   On exam today Michele Gibson reports she has been well in the interim since her last visit.  She has been undergoing physical therapy and using stretches before her shift at [employer].  She notes she has not been having any issues with fevers, chills, sweats.  She has been losing weight intentionally down to 211 pounds from 230 pounds.  She notes that she does not have any issues with nausea, vomiting, or diarrhea.  She otherwise feels well with no infectious symptoms.  A full 10 point ROS is listed below.  MEDICAL HISTORY:  Past Medical History:  Diagnosis Date   GERD (gastroesophageal reflux disease)    Headache(784.0)    migraines dx about 2 years ago   HSV infection    Late prenatal care    Migraine    No pertinent past medical history    Rheumatoid arthritis (HCC)     SURGICAL HISTORY: Past Surgical History:  Procedure Laterality Date   NO PAST SURGERIES      SOCIAL HISTORY: Social History   Socioeconomic History   Marital status: Single    Spouse name: Not on file   Number of children: Not on file   Years of education: Not on file   Highest education level: Not on file  Occupational History   Not on file  Tobacco Use   Smoking status: Never   Smokeless tobacco: Never  Vaping Use   Vaping Use: Never used  Substance and  Sexual Activity   Alcohol use: Yes    Comment: occasionally   Drug use: No   Sexual activity: Not Currently    Partners: Male    Birth control/protection: None  Other Topics Concern   Not on file  Social History Narrative   Not on file   Social Determinants of Health   Financial Resource Strain: Not on file  Food Insecurity: Not on file  Transportation Needs: Not on file  Physical Activity: Not on file  Stress: Not on file  Social Connections: Not on file  Intimate Partner Violence: Not on file    FAMILY HISTORY: Family History  Problem Relation Age of Onset   Hypertension Mother    Anemia Mother    Migraines Mother    Lung cancer Mother    Diabetes Father    Deep vein thrombosis Maternal Grandmother    Hypertension Maternal Grandmother    Stroke Maternal Grandmother    Cancer Maternal Grandmother    Heart attack Paternal Grandmother    Hypertension Paternal Grandmother    Diabetes Paternal Grandmother    Gout Paternal Grandmother    Stomach cancer Paternal Uncle    Healthy Son     ALLERGIES:  is allergic to penicillins.  MEDICATIONS:  Current Outpatient Medications  Medication Sig Dispense Refill   Multiple Vitamins-Minerals (ONE-A-DAY WOMENS PO) Take by mouth daily.     pantoprazole (PROTONIX)  40 MG tablet pantoprazole 40 mg tablet,delayed release  TAKE 1 TABLET BY MOUTH EVERY DAY     No current facility-administered medications for this visit.    REVIEW OF SYSTEMS:   Constitutional: ( - ) fevers, ( - )  chills , ( - ) night sweats Eyes: ( - ) blurriness of vision, ( - ) double vision, ( - ) watery eyes Ears, nose, mouth, throat, and face: ( - ) mucositis, ( - ) sore throat Respiratory: ( - ) cough, ( - ) dyspnea, ( - ) wheezes Cardiovascular: ( - ) palpitation, ( - ) chest discomfort, ( - ) lower extremity swelling Gastrointestinal:  ( - ) nausea, ( - ) heartburn, ( - ) change in bowel habits Skin: ( - ) abnormal skin rashes Lymphatics: ( - ) new  lymphadenopathy, ( - ) easy bruising Neurological: ( - ) numbness, ( - ) tingling, ( - ) new weaknesses Behavioral/Psych: ( - ) mood change, ( - ) new changes  All other systems were reviewed with the patient and are negative.  PHYSICAL EXAMINATION:  Vitals:   05/25/21 1533  BP: 104/71  Pulse: 79  Resp: 17  Temp: 98.2 F (36.8 C)  SpO2: 100%   Filed Weights   05/25/21 1533  Weight: 211 lb 9.6 oz (96 kg)    GENERAL: Well-appearing young African-American female, alert, no distress and comfortable SKIN: skin color, texture, turgor are normal, no rashes or significant lesions EYES: conjunctiva are pink and non-injected, sclera clear LUNGS: clear to auscultation and percussion with normal breathing effort HEART: regular rate & rhythm and no murmurs and no lower extremity edema Musculoskeletal: no cyanosis of digits and no clubbing  PSYCH: alert & oriented x 3, fluent speech NEURO: no focal motor/sensory deficits  LABORATORY DATA:  I have reviewed the data as listed CBC Latest Ref Rng & Units 05/25/2021 11/22/2020 06/04/2017  WBC 4.0 - 10.5 K/uL 8.8 9.1 8.0  Hemoglobin 12.0 - 15.0 g/dL 57.3 22.0 25.4  Hematocrit 36.0 - 46.0 % 39.3 41.0 38.2  Platelets 150 - 400 K/uL 249 274 205    CMP Latest Ref Rng & Units 05/25/2021 11/22/2020 06/04/2017  Glucose 70 - 99 mg/dL 92 88 80  BUN 6 - 20 mg/dL 12 10 14   Creatinine 0.44 - 1.00 mg/dL 2.70 6.23  Sodium 135 - 145 mmol/L 141 139 134(L)  Potassium 3.5 - 5.1 mmol/L 4.1 4.2 3.8  Chloride 98 - 111 mmol/L 112(H) 107 106  CO2 22 - 32 mmol/L 20(L) 25 20(L)  Calcium 8.9 - 10.3 mg/dL 9.4 9.2 9.5  Total Protein 6.5 - 8.1 g/dL 7.2 7.5 -  Total Bilirubin 0.3 - 1.2 mg/dL 7.62) 0.6 -  Alkaline Phos 38 - 126 U/L 83 81 -  AST 15 - 41 U/L 14(L) 27 -  ALT 0 - 44 U/L 21 52(H) -    RADIOGRAPHIC STUDIES: No results found.  ASSESSMENT & PLAN Michele Gibson 25 y.o. female with medical history significant for mild leukocytosis who presents for a  follow up visit.   In the interim since her last visit her leukocytosis has resolved.  She is being evaluated by rheumatology for joint pain.  They recommended conservative therapy with physical therapy as there is no evidence of a inflammatory condition.  At this time given that her leukocytosis is resolved and she has no hematological issues there is no need for routine follow-up in our clinic.  We will be happy to see  her back if she were to develop new or worsening issues with the blood counts.   #Leukocytosis, resolved --will obtain labs today including CBC, CMP --patient seen by rheumatology for joint pain. No clear reason found for her joint pains.  Started on conservative measures with physical therapy. --etiology of the leukocytosis must have been transient in nature. Appears to have resolved.  --RTC PRN  No orders of the defined types were placed in this encounter.   All questions were answered. The patient knows to call the clinic with any problems, questions or concerns.  A total of more than 25 minutes were spent on this encounter with face-to-face time and non-face-to-face time, including preparing to see the patient, ordering tests and/or medications, counseling the patient and coordination of care as outlined above.   Ulysees Barns, MD Department of Hematology/Oncology [facility] Phone: [phone] Pager: [phone] Email: [email or web address]  05/25/2021 4:00 PM

## 2021-08-08 ENCOUNTER — Emergency Department (HOSPITAL_BASED_OUTPATIENT_CLINIC_OR_DEPARTMENT_OTHER)
Admission: EM | Admit: 2021-08-08 | Discharge: 2021-08-08 | Disposition: A | Discharge status: Home / Self Care | Payer: BC Managed Care – PPO | Attending: Emergency Medicine | Admitting: Emergency Medicine

## 2021-08-08 ENCOUNTER — Encounter (HOSPITAL_BASED_OUTPATIENT_CLINIC_OR_DEPARTMENT_OTHER): Payer: Self-pay | Admitting: *Deleted

## 2021-08-08 ENCOUNTER — Other Ambulatory Visit: Payer: Self-pay

## 2021-08-08 DIAGNOSIS — R519 Headache, unspecified: Secondary | ICD-10-CM | POA: Diagnosis present

## 2021-08-08 DIAGNOSIS — Z20822 Contact with and (suspected) exposure to covid-19: Secondary | ICD-10-CM | POA: Diagnosis not present

## 2021-08-08 DIAGNOSIS — J101 Influenza due to other identified influenza virus with other respiratory manifestations: Secondary | ICD-10-CM | POA: Diagnosis not present

## 2021-08-08 LAB — RESP PANEL BY RT-PCR (FLU A&B, COVID) ARPGX2
Influenza A by PCR: POSITIVE — AB
Influenza B by PCR: NEGATIVE
SARS Coronavirus 2 by RT PCR: NEGATIVE

## 2021-08-08 MED ORDER — KETOROLAC TROMETHAMINE 30 MG/ML IJ SOLN
30.0000 mg | Freq: Once | INTRAMUSCULAR | Status: AC
  Administered 2021-08-08: 30 mg via INTRAMUSCULAR
  Filled 2021-08-08: qty 1

## 2021-08-08 NOTE — ED Triage Notes (Signed)
Pt reports "ongoing migraine for 2 days". Feels nauseated. Sensitivity to light. Has been taking OTC meds without relief.

## 2021-08-08 NOTE — Discharge Instructions (Signed)
You were seen today and tested positive for the flu.  Take Tylenol or ibuprofen as needed for pain and body aches.  Make sure you are staying hydrated.

## 2021-08-08 NOTE — ED Provider Notes (Signed)
[facility] EMERGENCY DEPT Provider Note   CSN: [id number] Arrival date & time: 08/08/21  0259     History Chief Complaint  Patient presents with   Migraine    Michele Gibson is a 25 y.o. female.  HPI     This a 25 year old female with a history of migraines who presents with headache, eye nausea, chills, congestion.  She is here with her brother with similar symptoms.  They feel they may have had a sick contacts over Thanksgiving.  Onset of symptoms this past weekend.  She is not had any fevers.  She states she has had a migraine for the last 3 days.  She has a history of migraines.  Migraine is typical for her migraines.  She reports photophobia and nausea.  She has taken over-the-counter medications with minimal relief.  Rates pain 6 out of 10.  Denies cough, fevers, shortness of breath.  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    Past Surgical History:  Procedure Laterality Date   NO PAST SURGERIES       OB History     Gravida  1   Para  1   Term  1   Preterm      AB      Living  1      SAB      IAB      Ectopic      Multiple      Live Births  1           Family History  Problem Relation Age of Onset   Hypertension Mother    Anemia Mother    Migraines Mother    Lung cancer Mother    Diabetes Father    Deep vein thrombosis Maternal Grandmother    Hypertension Maternal Grandmother    Stroke Maternal Grandmother    Cancer Maternal Grandmother     Heart attack Paternal Grandmother    Hypertension Paternal Grandmother    Diabetes Paternal Grandmother    Gout Paternal Grandmother    Stomach cancer Paternal Uncle    Healthy Son     Social History   Tobacco Use   Smoking status: Never   Smokeless tobacco: Never  Vaping Use   Vaping Use: Never used  Substance Use Topics   Alcohol use: Yes    Comment: occasionally   Drug use: No    Home Medications Prior to Admission medications   Medication Sig Start Date End Date Taking? Authorizing Provider  Multiple Vitamins-Minerals (ONE-A-DAY WOMENS PO) Take by mouth daily.    [provider]  pantoprazole (PROTONIX) 40 MG tablet pantoprazole 40 mg tablet,delayed release  TAKE 1 TABLET BY MOUTH EVERY DAY    [provider]    Allergies    Penicillins  Review of Systems   Review of Systems  Constitutional:  Positive for chills. Negative for fever.  HENT:  Positive for congestion.  Eyes:  Positive for photophobia.  Respiratory:  Negative for cough and shortness of breath.   Cardiovascular:  Negative for chest pain.  Gastrointestinal:  Positive for nausea. Negative for abdominal pain and vomiting.  Neurological:  Positive for headaches.  All other systems reviewed and are negative.  Physical Exam Updated Vital Signs BP 123/73 (BP Location: Right Arm)   Pulse 86   Temp 98.5 F (36.9 C)   Resp 16   Ht 1.676 m (5\' 6" )   Wt 97.5 kg   SpO2 98%   BMI 34.70 kg/m   Physical Exam Vitals and nursing note reviewed.  Constitutional:      Appearance: She is well-developed. She is obese. She is not ill-appearing.  HENT:     Head: Normocephalic and atraumatic.     Right Ear: Tympanic membrane normal.     Left Ear: Tympanic membrane normal.     Nose: Congestion present.     Mouth/Throat:     Mouth: Mucous membranes are moist.  Eyes:     Pupils: Pupils are equal, round, and reactive to light.  Cardiovascular:     Rate and Rhythm: Normal rate and regular  rhythm.     Heart sounds: Normal heart sounds.  Pulmonary:     Effort: Pulmonary effort is normal. No respiratory distress.     Breath sounds: No wheezing.  Abdominal:     General: Bowel sounds are normal.     Palpations: Abdomen is soft.  Musculoskeletal:     Cervical back: Neck supple.     Right lower leg: No edema.     Left lower leg: No edema.     Comments: Cranial nerves II through XII intact, 5 out of 5 strength in all 4 extremities, no dysmetria to finger-nose-finger  Skin:    General: Skin is warm and dry.  Neurological:     Mental Status: She is alert and oriented to person, place, and time.  Psychiatric:        Mood and Affect: Mood normal.    ED Results / Procedures / Treatments   Labs (all labs ordered are listed, but only abnormal results are displayed) Labs Reviewed  RESP PANEL BY RT-PCR (FLU A&B, COVID) ARPGX2 - Abnormal; Notable for the following components:      Result Value   Influenza A by PCR POSITIVE (*)    All other components within normal limits    EKG None  Radiology No results found.  Procedures Procedures   Medications Ordered in ED Medications  ketorolac (TORADOL) 30 MG/ML injection 30 mg (30 mg Intramuscular Given 08/08/21 0406)    ED Course  I have reviewed the triage vital signs and the nursing notes.  Pertinent labs & imaging results that were available during my care of the patient were reviewed by me and considered in my medical decision making (see chart for details).    MDM Rules/Calculators/A&P                           Patient presents with upper respiratory symptoms and migraine.  She is nontoxic and vital signs are reassuring.  No red flags for headache.  Suspect systemic illness causing migraine.  Patient was given Toradol.  Influenza and COVID testing sent.  She is flu A positive.  She is out of the window for Tamiflu.  Recommend supportive measures at home.  She feels better after Toradol.  After history, exam, and  medical workup  I feel the patient has been appropriately medically screened and is safe for discharge home. Pertinent diagnoses were discussed with the patient. Patient was given return precautions.  Final Clinical Impression(s) / ED Diagnoses Final diagnoses:  Influenza A    Rx / DC Orders ED Discharge Orders     None        Shon Baton, MD 08/08/21 [phone]

## 2021-09-18 ENCOUNTER — Encounter (HOSPITAL_BASED_OUTPATIENT_CLINIC_OR_DEPARTMENT_OTHER): Payer: Self-pay | Admitting: *Deleted

## 2021-09-18 ENCOUNTER — Emergency Department (HOSPITAL_BASED_OUTPATIENT_CLINIC_OR_DEPARTMENT_OTHER)
Admission: EM | Admit: 2021-09-18 | Discharge: 2021-09-18 | Disposition: A | Discharge status: Home / Self Care | Payer: BC Managed Care – PPO | Attending: Emergency Medicine | Admitting: Emergency Medicine

## 2021-09-18 ENCOUNTER — Other Ambulatory Visit: Payer: Self-pay

## 2021-09-18 DIAGNOSIS — R1013 Epigastric pain: Secondary | ICD-10-CM | POA: Diagnosis present

## 2021-09-18 DIAGNOSIS — R112 Nausea with vomiting, unspecified: Secondary | ICD-10-CM | POA: Diagnosis not present

## 2021-09-18 LAB — URINALYSIS, ROUTINE W REFLEX MICROSCOPIC
Bilirubin Urine: NEGATIVE
Glucose, UA: NEGATIVE mg/dL
Hgb urine dipstick: NEGATIVE
Ketones, ur: NEGATIVE mg/dL
Nitrite: NEGATIVE
Protein, ur: NEGATIVE mg/dL
Specific Gravity, Urine: 1.014 (ref 1.005–1.030)
pH: 6.5 (ref 5.0–8.0)

## 2021-09-18 LAB — COMPREHENSIVE METABOLIC PANEL
ALT: 20 U/L (ref 0–44)
AST: 14 U/L — ABNORMAL LOW (ref 15–41)
Albumin: 4.5 g/dL (ref 3.5–5.0)
Alkaline Phosphatase: 63 U/L (ref 38–126)
Anion gap: 7 (ref 5–15)
BUN: 14 mg/dL (ref 6–20)
CO2: 25 mmol/L (ref 22–32)
Calcium: 9.1 mg/dL (ref 8.9–10.3)
Chloride: 104 mmol/L (ref 98–111)
Creatinine, Ser: 0.76 mg/dL (ref 0.44–1.00)
GFR, Estimated: >60 mL/min (ref >60)
Glucose, Bld: 126 mg/dL — ABNORMAL HIGH (ref 70–99)
Potassium: 3.7 mmol/L (ref 3.5–5.1)
Sodium: 136 mmol/L (ref 135–145)
Total Bilirubin: 0.3 mg/dL (ref 0.3–1.2)
Total Protein: 7.3 g/dL (ref 6.5–8.1)

## 2021-09-18 LAB — CBC WITH DIFFERENTIAL/PLATELET
Abs Immature Granulocytes: 0.02 10*3/uL (ref 0.00–0.07)
Basophils Absolute: 0 10*3/uL (ref 0.0–0.1)
Basophils Relative: 0 %
Eosinophils Absolute: 0.1 10*3/uL (ref 0.0–0.5)
Eosinophils Relative: 1 %
HCT: 38.1 % (ref 36.0–46.0)
Hemoglobin: 12.6 g/dL (ref 12.0–15.0)
Immature Granulocytes: 0 %
Lymphocytes Relative: 42 %
Lymphs Abs: 4.4 10*3/uL — ABNORMAL HIGH (ref 0.7–4.0)
MCH: 30.7 pg (ref 26.0–34.0)
MCHC: 33.1 g/dL (ref 30.0–36.0)
MCV: 92.9 fL (ref 80.0–100.0)
Monocytes Absolute: 0.5 10*3/uL (ref 0.1–1.0)
Monocytes Relative: 5 %
Neutro Abs: 5.5 10*3/uL (ref 1.7–7.7)
Neutrophils Relative %: 52 %
Platelets: 254 10*3/uL (ref 150–400)
RBC: 4.1 MIL/uL (ref 3.87–5.11)
RDW: 12.5 % (ref 11.5–15.5)
WBC: 10.6 10*3/uL — ABNORMAL HIGH (ref 4.0–10.5)
nRBC: 0 % (ref 0.0–0.2)

## 2021-09-18 LAB — HCG, SERUM, QUALITATIVE: Preg, Serum: NEGATIVE

## 2021-09-18 LAB — LACTIC ACID, PLASMA: Lactic Acid, Venous: 0.8 mmol/L (ref 0.5–1.9)

## 2021-09-18 MED ORDER — PANTOPRAZOLE SODIUM 40 MG IV SOLR
40.0000 mg | Freq: Once | INTRAVENOUS | Status: AC
Start: 2021-09-18 — End: 2021-09-18
  Administered 2021-09-18: 40 mg via INTRAVENOUS
  Filled 2021-09-18: qty 40

## 2021-09-18 MED ORDER — LACTATED RINGERS IV BOLUS
1000.0000 mL | Freq: Once | INTRAVENOUS | Status: AC
  Administered 2021-09-18: 1000 mL via INTRAVENOUS

## 2021-09-18 MED ORDER — OMEPRAZOLE 20 MG PO CPDR
20.0000 mg | DELAYED_RELEASE_CAPSULE | Freq: Every day | ORAL | 0 refills | Status: AC
Start: 2021-09-18 — End: ?

## 2021-09-18 MED ORDER — ONDANSETRON HCL 4 MG/2ML IJ SOLN
4.0000 mg | Freq: Once | INTRAMUSCULAR | Status: AC
  Administered 2021-09-18: 4 mg via INTRAVENOUS
  Filled 2021-09-18: qty 2

## 2021-09-18 MED ORDER — ONDANSETRON HCL 4 MG PO TABS: 4.0000 mg | ORAL_TABLET | Freq: Four times a day (QID) | ORAL | 0 refills | Status: AC | PRN

## 2021-09-18 NOTE — Discharge Instructions (Signed)
If you are not able to tolerate omeprazole, try taking famotidine (Pepcid AC).  You do not need a prescription for that medicine.  Talk with your primary care provider about possible referral to a gastroenterologist.

## 2021-09-18 NOTE — ED Triage Notes (Signed)
Pt c/o abdominal pain for the last few weeks; pt states she has had n/v and black stools

## 2021-09-18 NOTE — ED Notes (Signed)
Pt verbalizes understanding of discharge instructions. Opportunity for questioning and answers were provided. Pt discharged from ED to home.   ? ?

## 2021-09-18 NOTE — ED Provider Notes (Signed)
[facility] EMERGENCY DEPT Provider Note   CSN: [id number] Arrival date & time: 09/18/21  0251     History  Chief Complaint  Patient presents with   Abdominal Pain    Michele Gibson is a 26 y.o. female.  The history is provided by the patient.  Abdominal Pain She has no significant past history and comes in with 2-week history of epigastric pain with associated nausea and vomiting.  Pain is worse after eating, but nausea is much worse than pain following eating.  She has tried taking some antacids such as Pepto-Bismol which give very slight, temporary relief.  She has also had more frequent bowel movements and some of them have been black.  There is no radiation of her pain to the her back, chest, abdomen.  She thinks she may have had a 15 pound weight loss in the last month.  She denies fever, chills, sweats.   Home Medications Prior to Admission medications   Medication Sig Start Date End Date Taking? Authorizing Provider  Multiple Vitamins-Minerals (ONE-A-DAY WOMENS PO) Take by mouth daily.    [provider]  pantoprazole (PROTONIX) 40 MG tablet pantoprazole 40 mg tablet,delayed release  TAKE 1 TABLET BY MOUTH EVERY DAY    [provider]      Allergies    Penicillins    Review of Systems   Review of Systems  Gastrointestinal:  Positive for abdominal pain.  All other systems reviewed and are negative.  Physical Exam Updated Vital Signs BP 126/78 (BP Location: Left Arm)    Pulse 81    Temp 98 F (36.7 C) (Oral)    Resp 16    Ht  (1.702 m)    Wt 97.5 kg    SpO2 100%    BMI 33.67 kg/m  Physical Exam Vitals and nursing note reviewed.  26 year old female, resting comfortably and in no acute distress. Vital signs are normal. Oxygen saturation is 100%, which is normal. Head is normocephalic and atraumatic. PERRLA, EOMI. Oropharynx is clear. Neck is nontender and supple without adenopathy or JVD. Back is nontender and there is no CVA  tenderness. Lungs are clear without rales, wheezes, or rhonchi. Chest is nontender. Heart has regular rate and rhythm without murmur. Abdomen is soft, flat, nontender without masses or hepatosplenomegaly and peristalsis is hyperactive. Extremities have no cyanosis or edema, full range of motion is present. Skin is warm and dry without rash. Neurologic: Mental status is normal, cranial nerves are intact, there are no motor or sensory deficits.  ED Results / Procedures / Treatments   Labs (all labs ordered are listed, but only abnormal results are displayed) Labs Reviewed  CBC WITH DIFFERENTIAL/PLATELET - Abnormal; Notable for the following components:      Result Value   WBC 10.6 (*)    Lymphs Abs 4.4 (*)    All other components within normal limits  COMPREHENSIVE METABOLIC PANEL - Abnormal; Notable for the following components:   Glucose, Bld 126 (*)    AST 14 (*)    All other components within normal limits  URINALYSIS, ROUTINE W REFLEX MICROSCOPIC - Abnormal; Notable for the following components:   APPearance HAZY (*)    Leukocytes,Ua SMALL (*)    All other components within normal limits  LACTIC ACID, PLASMA  HCG, SERUM, QUALITATIVE   Procedures Procedures    Medications Ordered in ED Medications  ondansetron (ZOFRAN) injection 4 mg (4 mg Intravenous Given 09/18/21 0417)  lactated ringers bolus  1,000 mL (0 mLs Intravenous Stopped 09/18/21 0616)  pantoprazole (PROTONIX) injection 40 mg (40 mg Intravenous Given 09/18/21 0416)    ED Course/ Medical Decision Making/ A&P                           Medical Decision Making  Nausea, vomiting, epigastric pain, cause unclear.  Consider peptic ulcer disease, cholelithiasis, cholecystitis, pancreatitis.  Exam is benign.  We will check screening labs and give IV fluids, ondansetron, pantoprazole.  Old records are reviewed, and she has no relevant past visits.  She feels much better following above-noted treatment.  Labs are  unremarkable.  It is noted that she had pantoprazole listed on her medications, however patient states that she is not taking it because it causes nausea.  I am concerned that she might have similar reaction to other proton pump inhibitors but she is discharged with prescription for omeprazole as well as ondansetron.  Advised to switch to famotidine if she is unable to tolerate proton pump inhibitors.  Recommended GI consult, to discuss with primary care provider.  Return precautions discussed.        Final Clinical Impression(s) / ED Diagnoses Final diagnoses:  Epigastric pain  Nausea and vomiting, unspecified vomiting type    Rx / DC Orders ED Discharge Orders          Ordered    omeprazole (PRILOSEC) 20 MG capsule  Daily        09/18/21 0628    ondansetron (ZOFRAN) 4 MG tablet  Every 6 hours PRN        09/18/21 [id number]              Dione BoozeGlick, Jaspreet Bodner, MD 09/18/21 [phone]

## 2021-10-08 ENCOUNTER — Encounter (HOSPITAL_BASED_OUTPATIENT_CLINIC_OR_DEPARTMENT_OTHER): Payer: Self-pay | Admitting: Urology

## 2021-10-08 ENCOUNTER — Inpatient Hospital Stay (HOSPITAL_BASED_OUTPATIENT_CLINIC_OR_DEPARTMENT_OTHER)
Admission: EM | Admit: 2021-10-08 | Discharge: 2021-10-11 | DRG: 076 | Disposition: A | Discharge status: Home / Self Care | Payer: BC Managed Care – PPO | Attending: Internal Medicine | Admitting: Internal Medicine

## 2021-10-08 ENCOUNTER — Other Ambulatory Visit: Payer: Self-pay

## 2021-10-08 DIAGNOSIS — Z833 Family history of diabetes mellitus: Secondary | ICD-10-CM

## 2021-10-08 DIAGNOSIS — M069 Rheumatoid arthritis, unspecified: Secondary | ICD-10-CM | POA: Diagnosis present

## 2021-10-08 DIAGNOSIS — Z8249 Family history of ischemic heart disease and other diseases of the circulatory system: Secondary | ICD-10-CM

## 2021-10-08 DIAGNOSIS — Z880 Allergy status to penicillin: Secondary | ICD-10-CM

## 2021-10-08 DIAGNOSIS — A879 Viral meningitis, unspecified: Principal | ICD-10-CM | POA: Diagnosis present

## 2021-10-08 DIAGNOSIS — Z6833 Body mass index (BMI) 33.0-33.9, adult: Secondary | ICD-10-CM

## 2021-10-08 DIAGNOSIS — Z801 Family history of malignant neoplasm of trachea, bronchus and lung: Secondary | ICD-10-CM

## 2021-10-08 DIAGNOSIS — Z823 Family history of stroke: Secondary | ICD-10-CM

## 2021-10-08 DIAGNOSIS — Z800 Family history of malignant neoplasm of digestive organs: Secondary | ICD-10-CM

## 2021-10-08 DIAGNOSIS — G039 Meningitis, unspecified: Secondary | ICD-10-CM | POA: Diagnosis not present

## 2021-10-08 DIAGNOSIS — Z20822 Contact with and (suspected) exposure to covid-19: Secondary | ICD-10-CM | POA: Diagnosis present

## 2021-10-08 DIAGNOSIS — K219 Gastro-esophageal reflux disease without esophagitis: Secondary | ICD-10-CM | POA: Diagnosis present

## 2021-10-08 DIAGNOSIS — R519 Headache, unspecified: Secondary | ICD-10-CM

## 2021-10-08 DIAGNOSIS — Z79899 Other long term (current) drug therapy: Secondary | ICD-10-CM

## 2021-10-08 DIAGNOSIS — E669 Obesity, unspecified: Secondary | ICD-10-CM | POA: Diagnosis present

## 2021-10-08 LAB — PREGNANCY, URINE: Preg Test, Ur: NEGATIVE

## 2021-10-08 LAB — CBC WITH DIFFERENTIAL/PLATELET
Abs Immature Granulocytes: 0.03 10*3/uL (ref 0.00–0.07)
Basophils Absolute: 0 10*3/uL (ref 0.0–0.1)
Basophils Relative: 0 %
Eosinophils Absolute: 0 10*3/uL (ref 0.0–0.5)
Eosinophils Relative: 0 %
HCT: 39.2 % (ref 36.0–46.0)
Hemoglobin: 13 g/dL (ref 12.0–15.0)
Immature Granulocytes: 0 %
Lymphocytes Relative: 22 %
Lymphs Abs: 2.9 10*3/uL (ref 0.7–4.0)
MCH: 30.6 pg (ref 26.0–34.0)
MCHC: 33.2 g/dL (ref 30.0–36.0)
MCV: 92.2 fL (ref 80.0–100.0)
Monocytes Absolute: 0.7 10*3/uL (ref 0.1–1.0)
Monocytes Relative: 5 %
Neutro Abs: 9.5 10*3/uL — ABNORMAL HIGH (ref 1.7–7.7)
Neutrophils Relative %: 73 %
Platelets: 245 10*3/uL (ref 150–400)
RBC: 4.25 MIL/uL (ref 3.87–5.11)
RDW: 12.3 % (ref 11.5–15.5)
WBC: 13.1 10*3/uL — ABNORMAL HIGH (ref 4.0–10.5)
nRBC: 0 % (ref 0.0–0.2)

## 2021-10-08 LAB — RESP PANEL BY RT-PCR (FLU A&B, COVID) ARPGX2
Influenza A by PCR: NEGATIVE
Influenza B by PCR: NEGATIVE
SARS Coronavirus 2 by RT PCR: NEGATIVE

## 2021-10-08 LAB — COMPREHENSIVE METABOLIC PANEL
ALT: 16 U/L (ref 0–44)
AST: 13 U/L — ABNORMAL LOW (ref 15–41)
Albumin: 4.3 g/dL (ref 3.5–5.0)
Alkaline Phosphatase: 65 U/L (ref 38–126)
Anion gap: 9 (ref 5–15)
BUN: 9 mg/dL (ref 6–20)
CO2: 21 mmol/L — ABNORMAL LOW (ref 22–32)
Calcium: 8.9 mg/dL (ref 8.9–10.3)
Chloride: 108 mmol/L (ref 98–111)
Creatinine, Ser: 0.71 mg/dL (ref 0.44–1.00)
GFR, Estimated: >60 mL/min (ref >60)
Glucose, Bld: 81 mg/dL (ref 70–99)
Potassium: 3.5 mmol/L (ref 3.5–5.1)
Sodium: 138 mmol/L (ref 135–145)
Total Bilirubin: 0.7 mg/dL (ref 0.3–1.2)
Total Protein: 7.1 g/dL (ref 6.5–8.1)

## 2021-10-08 MED ORDER — DEXTROSE 5 % IV SOLN
750.0000 mg | Freq: Once | INTRAVENOUS | Status: AC
  Administered 2021-10-08: 750 mg via INTRAVENOUS
  Filled 2021-10-08: qty 15

## 2021-10-08 MED ORDER — SODIUM CHLORIDE 0.9 % IV SOLN
2.0000 g | Freq: Two times a day (BID) | INTRAVENOUS | Status: DC
  Administered 2021-10-09: 2 g via INTRAVENOUS
  Filled 2021-10-08 (×2): qty 20

## 2021-10-08 MED ORDER — VANCOMYCIN HCL IN DEXTROSE 1-5 GM/200ML-% IV SOLN
1000.0000 mg | Freq: Once | INTRAVENOUS | Status: AC
  Administered 2021-10-08: 1000 mg via INTRAVENOUS
  Filled 2021-10-08: qty 200

## 2021-10-08 MED ORDER — METOCLOPRAMIDE HCL 5 MG/ML IJ SOLN
10.0000 mg | Freq: Once | INTRAMUSCULAR | Status: AC
  Administered 2021-10-08: 10 mg via INTRAVENOUS
  Filled 2021-10-08: qty 2

## 2021-10-08 MED ORDER — SODIUM CHLORIDE 0.9 % IV SOLN: INTRAVENOUS | Status: DC

## 2021-10-08 MED ORDER — SODIUM CHLORIDE 0.9 % IV BOLUS
1000.0000 mL | Freq: Once | INTRAVENOUS | Status: AC
  Administered 2021-10-08: 1000 mL via INTRAVENOUS

## 2021-10-08 MED ORDER — SODIUM CHLORIDE 0.9 % IV SOLN
2.0000 g | Freq: Once | INTRAVENOUS | Status: AC
  Administered 2021-10-09: 2 g via INTRAVENOUS
  Filled 2021-10-08: qty 20

## 2021-10-08 MED ORDER — DIPHENHYDRAMINE HCL 50 MG/ML IJ SOLN
25.0000 mg | Freq: Once | INTRAMUSCULAR | Status: AC
  Administered 2021-10-08: 25 mg via INTRAVENOUS
  Filled 2021-10-08: qty 1

## 2021-10-08 MED ORDER — SODIUM CHLORIDE 0.9 % IV SOLN: Freq: Once | INTRAVENOUS | Status: DC

## 2021-10-08 MED ORDER — SULFAMETHOXAZOLE-TRIMETHOPRIM 400-80 MG/5ML IV SOLN: 5.0000 mg/kg | Freq: Once | INTRAVENOUS | Status: DC

## 2021-10-08 MED ORDER — ACETAMINOPHEN 500 MG PO TABS
1000.0000 mg | ORAL_TABLET | Freq: Once | ORAL | Status: AC
  Administered 2021-10-08: 1000 mg via ORAL
  Filled 2021-10-08: qty 2

## 2021-10-08 NOTE — ED Triage Notes (Signed)
Migraine x 2 days Sensitivity to light and sound Nausea

## 2021-10-08 NOTE — ED Provider Notes (Addendum)
MEDCENTER [facility] EMERGENCY DEPT Provider Note   CSN: [id number] Arrival date & time: 10/08/21  1908     History  Chief Complaint  Patient presents with   Migraine    Michele Gibson is a 26 y.o. female.   Migraine   26 year old female with a history of migraine headaches, HSV infection, GERD presenting to the emergency department with a chief complaint of headache.  The patient endorses a gradual onset headache that began 2 days ago.  She endorses light sensitivity, sound sensitivity, nausea.  She denies any neurologic deficits.  She does endorse neck stiffness.  Her headache is throbbing and she feels that it radiates from her neck to her forehead.  It was not sudden in onset or maximal in onset.  She denies any recent head trauma.  She denies fevers, endorses chills.  Home Medications Prior to Admission medications   Medication Sig Start Date End Date Taking? Authorizing Provider  Multiple Vitamins-Minerals (ONE-A-DAY WOMENS PO) Take by mouth daily.    [provider]  omeprazole (PRILOSEC) 20 MG capsule Take 1 capsule (20 mg total) by mouth daily. 09/18/21   Dione Booze, MD  ondansetron (ZOFRAN) 4 MG tablet Take 1 tablet (4 mg total) by mouth every 6 (six) hours as needed for nausea. 09/18/21   Dione Booze, MD      Allergies    Penicillins    Review of Systems   Review of Systems  All other systems reviewed and are negative.  Physical Exam Updated Vital Signs BP 117/79    Pulse 92    Temp 100.3 F (37.9 C) (Rectal)    Resp 16    Ht 5\' 7"  (1.702 m)    Wt 97.5 kg    SpO2 100%    BMI 33.67 kg/m  Physical Exam Vitals and nursing note reviewed.  Constitutional:      General: She is not in acute distress.    Appearance: She is well-developed.  HENT:     Head: Normocephalic and atraumatic.  Eyes:     Conjunctiva/sclera: Conjunctivae normal.  Neck:     Comments: Pain with attempted range of motion of the neck.  No clear Kernig's or Brudzinski sign,  tenderness to palpation of the posterior neck. Cardiovascular:     Rate and Rhythm: Normal rate and regular rhythm.     Heart sounds: No murmur heard. Pulmonary:     Effort: Pulmonary effort is normal. No respiratory distress.     Breath sounds: Normal breath sounds.  Abdominal:     Palpations: Abdomen is soft.     Tenderness: There is no abdominal tenderness.  Musculoskeletal:        General: No swelling.     Cervical back: Neck supple.  Skin:    General: Skin is warm and dry.     Capillary Refill: Capillary refill takes less than 2 seconds.  Neurological:     Mental Status: She is alert.     Comments: MENTAL STATUS EXAM:    Orientation: Alert and oriented to person, place and time.  Memory: Cooperative, follows commands well.  Language: Speech is clear and language is normal.   CRANIAL NERVES:    CN 2 (Optic): Visual fields intact to confrontation.  CN 3,4,6 (EOM): Pupils equal and reactive to light. Full extraocular eye movement without nystagmus.  CN 5 (Trigeminal): Facial sensation is normal, no weakness of masticatory muscles.  CN 7 (Facial): No facial weakness or asymmetry.  CN 8 (Auditory): Auditory acuity  grossly normal.  CN 9,10 (Glossophar): The uvula is midline, the palate elevates symmetrically.  CN 11 (spinal access): Normal sternocleidomastoid and trapezius strength.  CN 12 (Hypoglossal): The tongue is midline. No atrophy or fasciculations.Marland Kitchen   MOTOR:  Muscle Strength: 5/5RUE, 5/5LUE, 5/5RLE, 5/5LLE    COORDINATION:   Intact finger-to-nose, no tremo.   SENSATION:   Intact to light touch all four extremities.     Psychiatric:        Mood and Affect: Mood normal.    ED Results / Procedures / Treatments   Labs (all labs ordered are listed, but only abnormal results are displayed) Labs Reviewed  COMPREHENSIVE METABOLIC PANEL - Abnormal; Notable for the following components:      Result Value   CO2 21 (*)    AST 13 (*)    All other components within  normal limits  CBC WITH DIFFERENTIAL/PLATELET - Abnormal; Notable for the following components:   WBC 13.1 (*)    Neutro Abs 9.5 (*)    All other components within normal limits  RESP PANEL BY RT-PCR (FLU A&B, COVID) ARPGX2  CSF CULTURE W GRAM STAIN  PREGNANCY, URINE  CSF CELL COUNT WITH DIFFERENTIAL  PROTEIN AND GLUCOSE, CSF  HSV 1/2 PCR, CSF  CSF CELL COUNT WITH DIFFERENTIAL    EKG None  Radiology No results found.  Procedures .Lumbar Puncture  Date/Time: 10/08/2021 11:30 AM Performed by: Ernie Avena, MD Authorized by: Ernie Avena, MD   Consent:    Consent obtained:  Verbal   Consent given by:  Patient   Risks discussed:  Bleeding, infection, pain, repeat procedure, nerve damage and headache   Alternatives discussed:  No treatment and delayed treatment Universal protocol:    Patient identity confirmed:  Verbally with patient Pre-procedure details:    Procedure purpose:  Diagnostic   Preparation: Patient was prepped and draped in usual sterile fashion   Anesthesia:    Anesthesia method:  Local infiltration   Local anesthetic:  Lidocaine 1% WITH epi and lidocaine 1% w/o epi Procedure details:    Lumbar space:  L4-L5 interspace   Patient position:  Sitting   Needle gauge:  20   Needle length (in):  3.5   Ultrasound guidance: no     Number of attempts:  1   Fluid appearance:  Clear   Tubes of fluid:  4   Total volume (ml):  8 Post-procedure details:    Puncture site:  Adhesive bandage applied   Procedure completion:  Tolerated .Critical Care Performed by: Ernie Avena, MD Authorized by: Ernie Avena, MD   Critical care provider statement:    Critical care time (minutes):  40   Critical care was necessary to treat or prevent imminent or life-threatening deterioration of the following conditions:  CNS failure or compromise   Critical care was time spent personally by me on the following activities:  Development of treatment plan with patient or  surrogate, evaluation of patient's response to treatment, obtaining history from patient or surrogate, ordering and performing treatments and interventions, ordering and review of laboratory studies, re-evaluation of patient's condition and review of old charts    Medications Ordered in ED Medications  sodium chloride 0.9 % bolus 1,000 mL ( Intravenous Stopped 10/08/21 2124)    And  0.9 %  sodium chloride infusion ( Intravenous New Bag/Given 10/08/21 2344)  vancomycin (VANCOCIN) IVPB 1000 mg/200 mL premix (1,000 mg Intravenous New Bag/Given 10/08/21 2346)  acyclovir (ZOVIRAX) 750 mg in dextrose 5 % 150 mL  IVPB (750 mg Intravenous New Bag/Given 10/08/21 2349)  cefTRIAXone (ROCEPHIN) 2 g in sodium chloride 0.9 % 100 mL IVPB (has no administration in time range)  cefTRIAXone (ROCEPHIN) 2 g in sodium chloride 0.9 % 100 mL IVPB (has no administration in time range)  metoCLOPramide (REGLAN) injection 10 mg (10 mg Intravenous Given 10/08/21 2024)  diphenhydrAMINE (BENADRYL) injection 25 mg (25 mg Intravenous Given 10/08/21 2025)  acetaminophen (TYLENOL) tablet 1,000 mg (1,000 mg Oral Given 10/08/21 2015)    ED Course/ Medical Decision Making/ A&P Clinical Course as of 10/09/21 0046  Mon Oct 08, 2021  2128 Temp: 100.3 F (37.9 C) [JL]  2132 Temp: 100.3 F (37.9 C) [JL]  2211 WBC(!): 13.1 [JL]    Clinical Course User Index [JL] Ernie Avena, MD                           Medical Decision Making Amount and/or Complexity of Data Reviewed Labs: ordered. Decision-making details documented in ED Course.  Risk OTC drugs. Prescription drug management. Decision regarding hospitalization.    26 year old female with a history of migraine headaches, HSV infection, GERD presenting to the emergency department with a chief complaint of headache.  The patient endorses a gradual onset headache that began 2 days ago.  She endorses light sensitivity, sound sensitivity, nausea.  She denies any neurologic  deficits.  She does endorse neck stiffness.  Her headache is throbbing and she feels that it radiates from her neck to her forehead.  It was not sudden in onset or maximal in onset.  She denies any recent head trauma.  She denies fevers, endorses chills.  Michele Gibson is a 26 y.o. female who presents with headache as per above. I have reviewed the nursing documentation for past medical history, family history, and social history. I have reviewed the EMR and have learned that she has a history of HSV infection and migraine headaches.  She is awake, alert, GCS 15, HDS.  She initially was afebrile per oral temperature but had a borderline febrile rectal temperature to 100.3.  Her exam is most notable for fully intact extraocular motions with bilaterally reactive pupils, no focal neurologic deficits.  She has some findings concerning for possible developing meningismus.  There is no rash. The headache was not sudden onset or the worst headache of the patient's life. There is no visual deficit.  I am most concerned for encephalitis given the patient's history of HSV versus meningitis versus migraine headache from a viral URI, COVID-19 and influenza..  To further evaluate and risk stratify her, labs were obtained, which were significant for:  CBC with leukocytosis to 13.1, urine pregnancy negative, COVID-19 and influenza PCR negative, CMP generally unremarkable.  The patient was treated with a migraine cocktail with an IV fluid bolus, IV Benadryl, IV Reglan, 1 g Tylenol.  Given the patient's leukocytosis, borderline fever with a temperature of 100.3, headache, neck stiffness, the risks and benefits of lumbar puncture were discussed with the patient and the patient elected to proceed with lumbar puncture.  She was covered for meningitis and encephalitis with vancomycin, Rocephin, Acyclovir.  She remained hemodynamically stable and at her baseline mental status.  Lumbar puncture was performed and clear fluid  was obtained.  Samples were sent for CSF analysis.  Hospitalist medicine was consulted for admission.  Signout given to Dr. Pilar Plate at 0000.  ED Medication Summary: Medications  sodium chloride 0.9 % bolus 1,000 mL ( Intravenous  Stopped 10/08/21 2124)    And  0.9 %  sodium chloride infusion ( Intravenous New Bag/Given 10/08/21 2344)  vancomycin (VANCOCIN) IVPB 1000 mg/200 mL premix (1,000 mg Intravenous New Bag/Given 10/08/21 2346)  acyclovir (ZOVIRAX) 750 mg in dextrose 5 % 150 mL IVPB (750 mg Intravenous New Bag/Given 10/08/21 2349)  cefTRIAXone (ROCEPHIN) 2 g in sodium chloride 0.9 % 100 mL IVPB (has no administration in time range)  cefTRIAXone (ROCEPHIN) 2 g in sodium chloride 0.9 % 100 mL IVPB (has no administration in time range)  metoCLOPramide (REGLAN) injection 10 mg (10 mg Intravenous Given 10/08/21 2024)  diphenhydrAMINE (BENADRYL) injection 25 mg (25 mg Intravenous Given 10/08/21 2025)  acetaminophen (TYLENOL) tablet 1,000 mg (1,000 mg Oral Given 10/08/21 2015)      Final Clinical Impression(s) / ED Diagnoses Final diagnoses:  Acute nonintractable headache, unspecified headache type    Rx / DC Orders ED Discharge Orders     None         Ernie AvenaLawsing, Hansford Hirt, MD 10/09/21 [id number]    Ernie AvenaLawsing, Jonia Oakey, MD 10/09/21 1305

## 2021-10-08 NOTE — ED Notes (Signed)
Pt c/c of migraine. Pt with history of same. Nausea without vomiting, light sensitive, also noise sensitive.

## 2021-10-08 NOTE — ED Provider Notes (Signed)
°  Provider Note MRN:  [id number]  Arrival date & time: 10/08/21    ED Course and Medical Decision Making  Assumed care from Dr. Karene Fry at shift change.  Headache with neck stiffness and fever, concern for meningitis vs encephalitis.  Lumbar puncture performed and is pending, will request admission for observation.  Procedures  Final Clinical Impressions(s) / ED Diagnoses     ICD-10-CM   1. Acute nonintractable headache, unspecified headache type  R51.9       ED Discharge Orders     None       Discharge Instructions   None     Elmer Sow. Pilar Plate, MD [facility] Emergency Medicine [facility] [email or web address]    Sabas Sous, MD 10/08/21 [phone]

## 2021-10-09 DIAGNOSIS — Z833 Family history of diabetes mellitus: Secondary | ICD-10-CM | POA: Diagnosis not present

## 2021-10-09 DIAGNOSIS — Z20822 Contact with and (suspected) exposure to covid-19: Secondary | ICD-10-CM | POA: Diagnosis present

## 2021-10-09 DIAGNOSIS — Z801 Family history of malignant neoplasm of trachea, bronchus and lung: Secondary | ICD-10-CM | POA: Diagnosis not present

## 2021-10-09 DIAGNOSIS — M069 Rheumatoid arthritis, unspecified: Secondary | ICD-10-CM | POA: Diagnosis present

## 2021-10-09 DIAGNOSIS — G039 Meningitis, unspecified: Secondary | ICD-10-CM | POA: Diagnosis present

## 2021-10-09 DIAGNOSIS — Z800 Family history of malignant neoplasm of digestive organs: Secondary | ICD-10-CM | POA: Diagnosis not present

## 2021-10-09 DIAGNOSIS — Z823 Family history of stroke: Secondary | ICD-10-CM | POA: Diagnosis not present

## 2021-10-09 DIAGNOSIS — Z6833 Body mass index (BMI) 33.0-33.9, adult: Secondary | ICD-10-CM | POA: Diagnosis not present

## 2021-10-09 DIAGNOSIS — K219 Gastro-esophageal reflux disease without esophagitis: Secondary | ICD-10-CM | POA: Diagnosis present

## 2021-10-09 DIAGNOSIS — Z880 Allergy status to penicillin: Secondary | ICD-10-CM | POA: Diagnosis not present

## 2021-10-09 DIAGNOSIS — Z79899 Other long term (current) drug therapy: Secondary | ICD-10-CM | POA: Diagnosis not present

## 2021-10-09 DIAGNOSIS — E669 Obesity, unspecified: Secondary | ICD-10-CM | POA: Diagnosis present

## 2021-10-09 DIAGNOSIS — Z8249 Family history of ischemic heart disease and other diseases of the circulatory system: Secondary | ICD-10-CM | POA: Diagnosis not present

## 2021-10-09 DIAGNOSIS — A879 Viral meningitis, unspecified: Secondary | ICD-10-CM | POA: Diagnosis present

## 2021-10-09 LAB — CSF CELL COUNT WITH DIFFERENTIAL
Eosinophils, CSF: 0 % (ref 0–1)
Eosinophils, CSF: 0 % (ref 0–1)
Lymphs, CSF: 94 % — ABNORMAL HIGH (ref 40–80)
Lymphs, CSF: 94 % — ABNORMAL HIGH (ref 40–80)
Monocyte-Macrophage-Spinal Fluid: 3 % — ABNORMAL LOW (ref 15–45)
Monocyte-Macrophage-Spinal Fluid: 4 % — ABNORMAL LOW (ref 15–45)
RBC Count, CSF: 14 /mm3 — ABNORMAL HIGH
RBC Count, CSF: 26 /mm3 — ABNORMAL HIGH
Segmented Neutrophils-CSF: 2 % (ref 0–6)
Segmented Neutrophils-CSF: 3 % (ref 0–6)
Tube #: 1
Tube #: 4
WBC, CSF: 380 /mm3 (ref 0–5)
WBC, CSF: 700 /mm3 (ref 0–5)

## 2021-10-09 LAB — PATHOLOGIST SMEAR REVIEW

## 2021-10-09 LAB — PROTEIN AND GLUCOSE, CSF
Glucose, CSF: 42 mg/dL (ref 40–70)
Total  Protein, CSF: 108 mg/dL — ABNORMAL HIGH (ref 15–45)

## 2021-10-09 MED ORDER — HALOPERIDOL LACTATE 5 MG/ML IJ SOLN
2.0000 mg | Freq: Once | INTRAMUSCULAR | Status: AC
  Administered 2021-10-09: 2 mg via INTRAVENOUS
  Filled 2021-10-09: qty 1

## 2021-10-09 MED ORDER — KETOROLAC TROMETHAMINE 30 MG/ML IJ SOLN
30.0000 mg | Freq: Four times a day (QID) | INTRAMUSCULAR | Status: DC | PRN
  Administered 2021-10-09 – 2021-10-11 (×4): 30 mg via INTRAVENOUS
  Filled 2021-10-09 (×4): qty 1

## 2021-10-09 MED ORDER — ADULT MULTIVITAMIN W/MINERALS CH
1.0000 | ORAL_TABLET | Freq: Every day | ORAL | Status: DC
  Administered 2021-10-09 – 2021-10-11 (×3): 1 via ORAL
  Filled 2021-10-09 (×3): qty 1

## 2021-10-09 MED ORDER — PANTOPRAZOLE SODIUM 40 MG PO TBEC
40.0000 mg | DELAYED_RELEASE_TABLET | Freq: Every day | ORAL | Status: DC
  Administered 2021-10-09 – 2021-10-11 (×3): 40 mg via ORAL
  Filled 2021-10-09 (×3): qty 1

## 2021-10-09 MED ORDER — DEXTROSE 5 % IV SOLN
750.0000 mg | Freq: Three times a day (TID) | INTRAVENOUS | Status: DC
  Administered 2021-10-09 – 2021-10-11 (×7): 750 mg via INTRAVENOUS
  Filled 2021-10-09 (×10): qty 15

## 2021-10-09 MED ORDER — ACETAMINOPHEN 325 MG PO TABS
650.0000 mg | ORAL_TABLET | Freq: Four times a day (QID) | ORAL | Status: DC | PRN
  Administered 2021-10-09 – 2021-10-11 (×3): 650 mg via ORAL
  Filled 2021-10-09 (×3): qty 2

## 2021-10-09 MED ORDER — ONDANSETRON HCL 4 MG PO TABS: 4.0000 mg | ORAL_TABLET | Freq: Four times a day (QID) | ORAL | Status: DC | PRN

## 2021-10-09 MED ORDER — VANCOMYCIN HCL IN DEXTROSE 1-5 GM/200ML-% IV SOLN
1000.0000 mg | Freq: Three times a day (TID) | INTRAVENOUS | Status: DC
  Administered 2021-10-09: 1000 mg via INTRAVENOUS
  Filled 2021-10-09 (×3): qty 200

## 2021-10-09 MED ORDER — SODIUM CHLORIDE 0.9 % IV SOLN
INTRAVENOUS | Status: DC
  Administered 2021-10-09: 450 mL via INTRAVENOUS

## 2021-10-09 MED ORDER — ACYCLOVIR SODIUM 50 MG/ML IV SOLN
INTRAVENOUS | Status: AC
  Filled 2021-10-09: qty 10

## 2021-10-09 MED ORDER — SODIUM CHLORIDE 0.9 % IV SOLN: INTRAVENOUS | Status: DC | PRN

## 2021-10-09 MED ORDER — KETOROLAC TROMETHAMINE 15 MG/ML IJ SOLN
15.0000 mg | Freq: Once | INTRAMUSCULAR | Status: AC
  Administered 2021-10-09: 15 mg via INTRAVENOUS
  Filled 2021-10-09: qty 1

## 2021-10-09 NOTE — ED Notes (Signed)
CSF WBC count for tube 1 is 700 and tube WBC count for tube is 380. Verified and read back by [facility].

## 2021-10-09 NOTE — H&P (Signed)
History and Physical    Michele Gibson:[id number] DOB: 11/27/1995 DOA: 10/08/2021  PCP: Penelope Galas, MD (Confirm with patient/family/NH records and if not entered, this has to be entered at [facility] point of entry) Patient coming from: Home  I have personally briefly reviewed patient's old medical records in [facility] Link  Chief Complaint: Headache, neck pain and fever  HPI: Michele Gibson is a 26 y.o. female with medical history significant of migraines headache, remote history of HSV infection, GERD, came with worsening of headache, neck pain and fever.  Symptoms started 2 days ago, while at work patient developed throbbing like headache, bilateral frontal, associated with fever light and feeling nauseous.  Symptoms are gradually getting worse and yesterday evening severity rates 10/10, and also started to have neck pain and stiffness and "pressure on both eye balls, feels like they are bulging out".  Had subjective fever.  She took some OTC Tylenol with no relief and came to ED.  She lives by herself with 3 young children, denies any sick contact, she is sexually active but denied any herpes flareup recently.  Denies any rash, no urinary symptoms.  ED Course: Low-grade fever 100.3, no tachycardia no hypotension.  WBC 13.1.  Lumbar puncture done in the ED.  Patient was started with empirical bacterial and viral meningitis treatment with vancomycin, ceftriaxone and acyclovir.  Review of Systems: As per HPI otherwise 14 point review of systems negative.    Past Medical History:  Diagnosis Date   GERD (gastroesophageal reflux disease)    Headache(784.0)    migraines dx about 2 years ago   HSV infection    Late prenatal care    Migraine    No pertinent past medical history    Rheumatoid arthritis (HCC)     Past Surgical History:  Procedure Laterality Date   NO PAST SURGERIES       reports that she has never smoked. She has never used smokeless tobacco. She reports current alcohol  use. She reports that she does not use drugs.    Family History  Problem Relation Age of Onset   Hypertension Mother    Anemia Mother    Migraines Mother    Lung cancer Mother    Diabetes Father    Deep vein thrombosis Maternal Grandmother    Hypertension Maternal Grandmother    Stroke Maternal Grandmother    Cancer Maternal Grandmother    Heart attack Paternal Grandmother    Hypertension Paternal Grandmother    Diabetes Paternal Grandmother    Gout Paternal Grandmother    Stomach cancer Paternal Uncle    Healthy Son      Prior to Admission medications   Medication Sig Start Date End Date Taking? Authorizing Provider  Multiple Vitamins-Minerals (ONE-A-DAY WOMENS PO) Take by mouth daily.   Yes [provider]  ondansetron (ZOFRAN) 4 MG tablet Take 1 tablet (4 mg total) by mouth every 6 (six) hours as needed for nausea. 09/18/21  Yes Dione Booze, MD  omeprazole (PRILOSEC) 20 MG capsule Take 1 capsule (20 mg total) by mouth daily. 09/18/21   Dione Booze, MD    Physical Exam: Vitals:   10/09/21 0800 10/09/21 0900 10/09/21 1000 10/09/21 1055  BP: 118/72 109/74 96/71   Pulse: 76 88 66   Resp: 17  15   Temp:    98.6 F (37 C)  TempSrc:    Oral  SpO2: 97% 99% 98%   Weight:      Height:  Constitutional: NAD, calm, comfortable Vitals:   10/09/21 0800 10/09/21 0900 10/09/21 1000 10/09/21 1055  BP: 118/72 109/74 96/71   Pulse: 76 88 66   Resp: 17  15   Temp:    98.6 F (37 C)  TempSrc:    Oral  SpO2: 97% 99% 98%   Weight:      Height:       Eyes: PERRL, lids and conjunctivae normal ENMT: Mucous membranes are moist. Posterior pharynx clear of any exudate or lesions.Normal dentition.  Neck: normal, somewhat stiff with tenderness on both sides, no masses, no thyromegaly Respiratory: clear to auscultation bilaterally, no wheezing, no crackles. Normal respiratory effort. No accessory muscle use.  Cardiovascular: Regular rate and rhythm, no murmurs / rubs /  gallops. No extremity edema. 2+ pedal pulses. No carotid bruits.  Abdomen: no tenderness, no masses palpated. No hepatosplenomegaly. Bowel sounds positive.  Musculoskeletal: no clubbing / cyanosis. No joint deformity upper and lower extremities. Good ROM, no contractures. Normal muscle tone.  Skin: no rashes, lesions, ulcers. No induration Neurologic: CN 2-12 grossly intact. Sensation intact, DTR normal. Strength 5/5 in all 4.  Psychiatric: Normal judgment and insight. Alert and oriented x 3. Normal mood.   Labs on Admission: I have personally reviewed following labs and imaging studies  CBC: Recent Labs  Lab 10/08/21 2143  WBC 13.1*  NEUTROABS 9.5*  HGB 13.0  HCT 39.2  MCV 92.2  PLT 245   Basic Metabolic Panel: Recent Labs  Lab 10/08/21 2143  NA 138  K 3.5  CL 108  CO2 21*  GLUCOSE 81  BUN 9  CREATININE 0.71  CALCIUM 8.9   GFR: Estimated Creatinine Clearance: 129 mL/min (by C-G formula based on SCr of 0.71 mg/dL). Liver Function Tests: Recent Labs  Lab 10/08/21 2143  AST 13*  ALT 16  ALKPHOS 65  BILITOT 0.7  PROT 7.1  ALBUMIN 4.3   No results for input(s): LIPASE, AMYLASE in the last 168 hours. No results for input(s): AMMONIA in the last 168 hours. Coagulation Profile: No results for input(s): INR, PROTIME in the last 168 hours. Cardiac Enzymes: No results for input(s): CKTOTAL, CKMB, CKMBINDEX, TROPONINI in the last 168 hours. BNP (last 3 results) No results for input(s): PROBNP in the last 8760 hours. HbA1C: No results for input(s): HGBA1C in the last 72 hours. CBG: No results for input(s): GLUCAP in the last 168 hours. Lipid Profile: No results for input(s): CHOL, HDL, LDLCALC, TRIG, CHOLHDL, LDLDIRECT in the last 72 hours. Thyroid Function Tests: No results for input(s): TSH, T4TOTAL, FREET4, T3FREE, THYROIDAB in the last 72 hours. Anemia Panel: No results for input(s): VITAMINB12, FOLATE, FERRITIN, TIBC, IRON, RETICCTPCT in the last 72  hours. Urine analysis:    Component Value Date/Time   COLORURINE YELLOW 09/18/2021 0520   APPEARANCEUR HAZY (A) 09/18/2021 0520   LABSPEC 1.014 09/18/2021 0520   PHURINE 6.5 09/18/2021 0520   GLUCOSEU NEGATIVE 09/18/2021 0520   HGBUR NEGATIVE 09/18/2021 0520   BILIRUBINUR NEGATIVE 09/18/2021 0520   KETONESUR NEGATIVE 09/18/2021 0520   PROTEINUR NEGATIVE 09/18/2021 0520   UROBILINOGEN 0.2 06/29/2013 1355   NITRITE NEGATIVE 09/18/2021 0520   LEUKOCYTESUR SMALL (A) 09/18/2021 0520    Radiological Exams on Admission: No results found.  EKG: None  Assessment/Plan Principal Problem:   Meningitis  (please populate well all problems here in Problem List. (For example, if patient is on BP meds at home and you resume or decide to hold them, it is a problem that  needs to be her. 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  Discussed with on-call infectious disease attending Dr. Luciana Axeomer, agreed with discontinue vancomycin and ceftriaxone, and keep acyclovir.  Also discussed with ID attending regarding other differential, [facility] fever unlikely given this is winter season.  GERD -Stable, continue PPI  Leukocytosis -Normal differential, likely related to meningitis, repeat CBC tomorrow.  DVT prophylaxis: SCD Code Status: Full code Family Communication: None at bedside Disposition Plan: Expect more than 2 midnight hospital stay Consults called: D/W ID attending over phone Admission status: Medsurg admit   Emeline GeneralPing T Abagael Kramm MD [facility] Pager [phone]  10/09/2021, 1:02 PM

## 2021-10-09 NOTE — Progress Notes (Addendum)
Pharmacy Antibiotic Note  Michele Gibson is a 26 y.o. female admitted on 10/08/2021 with  rule out meningitis .  Pharmacy has been consulted for Vancomycin/Acyclovir dosing. WBC mildly elevated. Renal function good. Noted PCN allergy of nausea/vomiting-pt has tolerated IV PCN previously.  Plan: Vancomycin 1000 mg IV q8h >>>Traditional dosing with meningitis  Ceftriaxone per MD Acyclovir 10 mg/kg IV q8h Trend WBC, temp, renal function  F/U infectious work-up Drug levels as indicated   Height: 5\' 7"  (170.2 cm) Weight: 97.5 kg (215 lb) IBW/kg (Calculated) : 61.6  Temp (24hrs), Avg:99.5 F (37.5 C), Min:98.6 F (37 C), Max:100.3 F (37.9 C)  Recent Labs  Lab 10/08/21 2143  WBC 13.1*  CREATININE 0.71    Estimated Creatinine Clearance: 129 mL/min (by C-G formula based on SCr of 0.71 mg/dL).     Abran Duke, PharmD, BCPS Clinical Pharmacist Phone: [phone]

## 2021-10-09 NOTE — ED Notes (Addendum)
Report received and care resumed.  Rounded on pt. Pt currently lying in bed with eye close but is easily aroused with verbal stimuli. No signs of distress noted. Breathing even and unlabored; VSS. Currently on monitor with IVF running. Will continue to monitor.

## 2021-10-10 DIAGNOSIS — G039 Meningitis, unspecified: Secondary | ICD-10-CM

## 2021-10-10 LAB — CBC
HCT: 38.6 % (ref 36.0–46.0)
Hemoglobin: 13.2 g/dL (ref 12.0–15.0)
MCH: 31.2 pg (ref 26.0–34.0)
MCHC: 34.2 g/dL (ref 30.0–36.0)
MCV: 91.3 fL (ref 80.0–100.0)
Platelets: 219 10*3/uL (ref 150–400)
RBC: 4.23 MIL/uL (ref 3.87–5.11)
RDW: 12 % (ref 11.5–15.5)
WBC: 9.8 10*3/uL (ref 4.0–10.5)
nRBC: 0 % (ref 0.0–0.2)

## 2021-10-10 LAB — BASIC METABOLIC PANEL
Anion gap: 7 (ref 5–15)
BUN: 5 mg/dL — ABNORMAL LOW (ref 6–20)
CO2: 23 mmol/L (ref 22–32)
Calcium: 8.9 mg/dL (ref 8.9–10.3)
Chloride: 109 mmol/L (ref 98–111)
Creatinine, Ser: 0.85 mg/dL (ref 0.44–1.00)
GFR, Estimated: >60 mL/min (ref >60)
Glucose, Bld: 87 mg/dL (ref 70–99)
Potassium: 3.6 mmol/L (ref 3.5–5.1)
Sodium: 139 mmol/L (ref 135–145)

## 2021-10-10 LAB — HSV 1/2 PCR, CSF
HSV-1 DNA: NEGATIVE
HSV-2 DNA: NEGATIVE

## 2021-10-10 NOTE — Progress Notes (Signed)
PROGRESS NOTE  Michele Gibson [id number] DOB: 08-01-1996 DOA: 10/08/2021 PCP: Malena Peer, MD  HPI/Recap of past 24 hours: Michele Gibson is a 26 y.o. female with medical history significant of migraines headache, remote history of HSV infection, GERD, came with worsening headache, neck pain and fever. Symptoms started 2 days ago, while at work, patient developed throbbing like headache, bilateral frontal, associated with fever and feeling nauseous.  Symptoms were worsening with 10/10 headache, neck pain/stiffness and "pressure on both eye balls, feels like they are bulging out".  Had subjective fever.  She took some OTC Tylenol with no relief and came to ED.  She lives by herself with 3 young children (2 siblings and 1 child), denies any sick contact, she is sexually active but denied any herpes flareup recently.  Denies any rash, no urinary symptoms. In the ED, noted low-grade fever 100.3, WBC 13.1.  Lumbar puncture done in the ED.  Patient was started with empirical bacterial and viral meningitis treatment with vancomycin, ceftriaxone and acyclovir.  Patient admitted for further management.    Today, patient reports improved symptoms, denies any further worsening headaches, neck stiffness/pain, no further fever noted.  Patient denies any blurry vision, dizziness, chest pain, abdominal pain, nausea/vomiting, shortness of breath.  Assessment/Plan: Principal Problem:   Meningitis   Likely viral meningitis Currently afebrile, with no leukocytosis LP done in the ED showed predominantly lymphocytes, culture with no growth in 1 day Admitting physician discussed with ID Dr. Linus Salmons, recommended discontinuing vancomycin and ceftriaxone but continuing IV acyclovir Continue IV acyclovir, discussed with ID for further recommendation Advised to stay hydrated Monitor closely  GERD Continue PPI  Obesity Lifestyle modification advised    Estimated body mass index is 33.67 kg/m as calculated from  the following:   Height as of this encounter: 5\' 7"  (1.702 m).   Weight as of this encounter: 97.5 kg.     Code Status: Full  Family Communication: Discussed with patient at bedside  Disposition Plan: Status is: Inpatient Remains inpatient appropriate because: Level of care     Consultants: Discussed with ID  Procedures: LP  Antimicrobials: IV acyclovir  DVT prophylaxis: SCDs   Objective: Vitals:   10/10/21 0437 10/10/21 0729 10/10/21 1138 10/10/21 1551  BP: 121/83 118/81 112/87 122/79  Pulse: 84 82 84 80  Resp: 16 16 16 18   Temp: 98.4 F (36.9 C) 98.7 F (37.1 C) 98.3 F (36.8 C) 98.7 F (37.1 C)  TempSrc: Oral Oral Oral Oral  SpO2: 99% 97% 99% 96%  Weight:      Height:        Intake/Output Summary (Last 24 hours) at 10/10/2021 1610 Last data filed at 10/09/2021 2300 Gross per 24 hour  Intake 930 ml  Output --  Net 930 ml   Filed Weights   10/08/21 1915  Weight: 97.5 kg    Exam: General: NAD  Cardiovascular: S1, S2 present Respiratory: CTAB Abdomen: Soft, nontender, nondistended, bowel sounds present Musculoskeletal: No bilateral pedal edema noted Skin: Normal Psychiatry: Normal mood  Neurology: No obvious focal neurologic deficits noted, strength equal in all extremities   Data Reviewed: CBC: Recent Labs  Lab 10/08/21 2143 10/10/21 0359  WBC 13.1* 9.8  NEUTROABS 9.5*  --   HGB 13.0 13.2  HCT 39.2 38.6  MCV 92.2 91.3  PLT 245 [id number]   Basic Metabolic Panel: Recent Labs  Lab 10/08/21 2143 10/10/21 0359  NA 138 139  K 3.5 3.6  CL 108 109  CO2 21* 23  GLUCOSE 81 87  BUN 9 5*  CREATININE 0.71 0.85  CALCIUM 8.9 8.9   GFR: Estimated Creatinine Clearance: 121.4 mL/min (by C-G formula based on SCr of 0.85 mg/dL). Liver Function Tests: Recent Labs  Lab 10/08/21 2143  AST 13*  ALT 16  ALKPHOS 65  BILITOT 0.7  PROT 7.1  ALBUMIN 4.3   No results for input(s): LIPASE, AMYLASE in the last 168 hours. No results for input(s):  AMMONIA in the last 168 hours. Coagulation Profile: No results for input(s): INR, PROTIME in the last 168 hours. Cardiac Enzymes: No results for input(s): CKTOTAL, CKMB, CKMBINDEX, TROPONINI in the last 168 hours. BNP (last 3 results) No results for input(s): PROBNP in the last 8760 hours. HbA1C: No results for input(s): HGBA1C in the last 72 hours. CBG: No results for input(s): GLUCAP in the last 168 hours. Lipid Profile: No results for input(s): CHOL, HDL, LDLCALC, TRIG, CHOLHDL, LDLDIRECT in the last 72 hours. Thyroid Function Tests: No results for input(s): TSH, T4TOTAL, FREET4, T3FREE, THYROIDAB in the last 72 hours. Anemia Panel: No results for input(s): VITAMINB12, FOLATE, FERRITIN, TIBC, IRON, RETICCTPCT in the last 72 hours. Urine analysis:    Component Value Date/Time   COLORURINE YELLOW 09/18/2021 0520   APPEARANCEUR HAZY (A) 09/18/2021 0520   LABSPEC 1.014 09/18/2021 0520   PHURINE 6.5 09/18/2021 0520   GLUCOSEU NEGATIVE 09/18/2021 0520   HGBUR NEGATIVE 09/18/2021 0520   BILIRUBINUR NEGATIVE 09/18/2021 0520   KETONESUR NEGATIVE 09/18/2021 0520   PROTEINUR NEGATIVE 09/18/2021 0520   UROBILINOGEN 0.2 06/29/2013 1355   NITRITE NEGATIVE 09/18/2021 0520   LEUKOCYTESUR SMALL (A) 09/18/2021 0520   Sepsis Labs: @LABRCNTIP (procalcitonin:4,lacticidven:4)  ) Recent Results (from the past 240 hour(s))  Resp Panel by RT-PCR (Flu A&B, Covid) Nasopharyngeal Swab     Status: None   Collection Time: 10/08/21  9:43 PM   Specimen: Nasopharyngeal Swab; Nasopharyngeal(NP) swabs in vial transport medium  Result Value Ref Range Status   SARS Coronavirus 2 by RT PCR NEGATIVE NEGATIVE Final    Comment: (NOTE) SARS-CoV-2 target nucleic acids are NOT DETECTED.  The SARS-CoV-2 RNA is generally detectable in upper respiratory specimens during the acute phase of infection. The lowest concentration of SARS-CoV-2 viral copies this assay can detect is 138 copies/mL. A negative result does  not preclude SARS-Cov-2 infection and should not be used as the sole basis for treatment or other patient management decisions. A negative result may occur with  improper specimen collection/handling, submission of specimen other than nasopharyngeal swab, presence of viral mutation(s) within the areas targeted by this assay, and inadequate number of viral copies(<138 copies/mL). A negative result must be combined with clinical observations, patient history, and epidemiological information. The expected result is Negative.  Fact Sheet for Patients:  EntrepreneurPulse.com.au  Fact Sheet for Healthcare Providers:  IncredibleEmployment.be  This test is no t yet approved or cleared by the [place] FDA and  has been authorized for detection and/or diagnosis of SARS-CoV-2 by FDA under an Emergency Use Authorization (EUA). This EUA will remain  in effect (meaning this test can be used) for the duration of the COVID-19 declaration under Section 564(b)(1) of the Act, 21 U.S.C.section 360bbb-3(b)(1), unless the authorization is terminated  or revoked sooner.       Influenza A by PCR NEGATIVE NEGATIVE Final   Influenza B by PCR NEGATIVE NEGATIVE Final    Comment: (NOTE) The Xpert Xpress SARS-CoV-2/FLU/RSV plus assay is intended as an aid in the diagnosis  of influenza from Nasopharyngeal swab specimens and should not be used as a sole basis for treatment. Nasal washings and aspirates are unacceptable for Xpert Xpress SARS-CoV-2/FLU/RSV testing.  Fact Sheet for Patients: EntrepreneurPulse.com.au  Fact Sheet for Healthcare Providers: IncredibleEmployment.be  This test is not yet approved or cleared by the [place] FDA and has been authorized for detection and/or diagnosis of SARS-CoV-2 by FDA under an Emergency Use Authorization (EUA). This EUA will remain in effect (meaning this test can be used) for the  duration of the COVID-19 declaration under Section 564(b)(1) of the Act, 21 U.S.C. section 360bbb-3(b)(1), unless the authorization is terminated or revoked.  Performed at [facility], [address] [place], [place] [address]   CSF culture w Gram Stain     Status: None (Preliminary result)   Collection Time: 10/08/21 11:31 PM   Specimen: Lumbar Puncture; Cerebrospinal Fluid  Result Value Ref Range Status   Specimen Description   Final    LUMBAR Performed at [facility], [address] [place], [place] [address]    Special Requests   Final    Normal Performed at [facility], [place], [place] [address]    Gram Stain CYTOSPIN SMEAR RARE WBC SEEN NO ORGANISMS SEEN   Final   Culture   Final    NO GROWTH 1 DAY Performed at [facility], [place] [address], [place], [place] [address]    Report Status PENDING  Incomplete      Studies: No results found.  Scheduled Meds:  multivitamin with minerals  1 tablet Oral Daily   pantoprazole  40 mg Oral Daily    Continuous Infusions:  sodium chloride 10 mL/hr at 10/09/21 [id number]   acyclovir 750 mg (10/10/21 1321)     LOS: 1 day     Alma Friendly, MD [facility]  If 7PM-7AM, please contact night-coverage [email or web address] 10/10/2021, 4:10 PM

## 2021-10-10 NOTE — Progress Notes (Signed)
°  Transition of Care [facility]) Screening Note   Patient Details  Name: [name] Date of Birth: 02/18/1996   [facility]) CM/SW Contact:    Kermit Balo, RN Phone Number: 10/10/2021, 4:10 PM    Transition of Care Department [facility]) has reviewed patient. We will continue to monitor patient advancement through interdisciplinary progression rounds. If new patient transition needs arise, please place a TOC consult.

## 2021-10-11 DIAGNOSIS — A879 Viral meningitis, unspecified: Principal | ICD-10-CM

## 2021-10-11 LAB — CBC WITH DIFFERENTIAL/PLATELET
Abs Immature Granulocytes: 0.04 10*3/uL (ref 0.00–0.07)
Basophils Absolute: 0.1 10*3/uL (ref 0.0–0.1)
Basophils Relative: 0 %
Eosinophils Absolute: 0.2 10*3/uL (ref 0.0–0.5)
Eosinophils Relative: 1 %
HCT: 39.1 % (ref 36.0–46.0)
Hemoglobin: 13.1 g/dL (ref 12.0–15.0)
Immature Granulocytes: 0 %
Lymphocytes Relative: 32 %
Lymphs Abs: 4 10*3/uL (ref 0.7–4.0)
MCH: 30.5 pg (ref 26.0–34.0)
MCHC: 33.5 g/dL (ref 30.0–36.0)
MCV: 91.1 fL (ref 80.0–100.0)
Monocytes Absolute: 1 10*3/uL (ref 0.1–1.0)
Monocytes Relative: 8 %
Neutro Abs: 7.1 10*3/uL (ref 1.7–7.7)
Neutrophils Relative %: 59 %
Platelets: 240 10*3/uL (ref 150–400)
RBC: 4.29 MIL/uL (ref 3.87–5.11)
RDW: 11.9 % (ref 11.5–15.5)
WBC: 12.3 10*3/uL — ABNORMAL HIGH (ref 4.0–10.5)
nRBC: 0 % (ref 0.0–0.2)

## 2021-10-11 LAB — BASIC METABOLIC PANEL
Anion gap: 10 (ref 5–15)
BUN: 9 mg/dL (ref 6–20)
CO2: 22 mmol/L (ref 22–32)
Calcium: 9.3 mg/dL (ref 8.9–10.3)
Chloride: 106 mmol/L (ref 98–111)
Creatinine, Ser: 0.81 mg/dL (ref 0.44–1.00)
GFR, Estimated: >60 mL/min (ref >60)
Glucose, Bld: 84 mg/dL (ref 70–99)
Potassium: 3.4 mmol/L — ABNORMAL LOW (ref 3.5–5.1)
Sodium: 138 mmol/L (ref 135–145)

## 2021-10-11 LAB — MAGNESIUM: Magnesium: 1.9 mg/dL (ref 1.7–2.4)

## 2021-10-11 MED ORDER — POTASSIUM CHLORIDE CRYS ER 20 MEQ PO TBCR
40.0000 meq | EXTENDED_RELEASE_TABLET | Freq: Once | ORAL | Status: AC
  Administered 2021-10-11: 40 meq via ORAL
  Filled 2021-10-11: qty 2

## 2021-10-11 NOTE — Progress Notes (Signed)
Discharge instructions discussed with patient. All questions answered. Iv removed. Pt walked off unit with this RN for transport home by family. Patient belongings returned.  Melony OverlyJanique  Genifer Lazenby, RN

## 2021-10-11 NOTE — Discharge Summary (Signed)
Physician Discharge Summary   Patient: Michele Gibson MRN: [id number] DOB: 1996-07-02  Admit date:     10/08/2021  Discharge date: 10/11/21  Discharge Physician: Alma Friendly   PCP: Malena Peer, MD   Recommendations at discharge:   Follow-up with PCP in 1 week  Discharge Diagnoses: Principal Problem:   Meningitis    Hospital Course: Michele Gibson is a 26 y.o. female with medical history significant of migraines headache, remote history of HSV infection, GERD, came with worsening headache, neck pain and fever. Symptoms started 2 days ago, while at work, patient developed throbbing like headache, bilateral frontal, associated with fever and feeling nauseous.  Symptoms were worsening with 10/10 headache, neck pain/stiffness and "pressure on both eye balls, feels like they are bulging out".  Had subjective fever.  She took some OTC Tylenol with no relief and came to ED.  She lives by herself with 3 young children (2 siblings and 1 child), denies any sick contact, she is sexually active but denied any herpes flareup recently.  Denies any rash, no urinary symptoms. In the ED, noted low-grade fever 100.3, WBC 13.1.  Lumbar puncture done in the ED.  Patient was started with empirical bacterial and viral meningitis treatment with vancomycin, ceftriaxone and acyclovir.  Patient admitted for further management.    Today, patient denies any symptoms, denies any headaches, neck stiffness/pain, remained afebrile, denies any blurry vision, dizziness, chest pain, shortness of breath, nausea/vomiting, blurry vision. Patient advised to rest for the next couple of days and follow-up with PCP in 1 week.  Advised to return to the ER if symptoms reoccurs.  Patient verbalized understanding.    Assessment and Plan: Likely viral meningitis Currently afebrile, with fluctuating leukocytosis LP done in the ED showed predominantly lymphocytes, culture with no growth in 1 day, CSF HSV PCR 1 and 2 both  negative Discussed with ID Dr. Linus Salmons, on 10/11/2021 recommended no further antiviral and to discontinue acyclovir, from an infectious disease standpoint Advised to stay hydrated, gently ease into daily activities Follow-up with PCP in 1 week with repeat labs and for follow-up   GERD Continue PPI   Obesity Lifestyle modification advised       Consultants: Discussed with ID Dr Linus Salmons Procedures performed: LP Disposition: Home Diet recommendation: Heart healthy Discharge Diet Orders (From admission, onward)     Start     Ordered   10/11/21 0000  Diet - low sodium heart healthy        10/11/21 1232           Cardiac diet  DISCHARGE MEDICATION: Allergies as of 10/11/2021       Reactions   Penicillins Nausea And Vomiting, Other (See Comments)   Has patient had a PCN reaction causing immediate rash, facial/tongue/throat swelling, SOB or lightheadedness with hypotension:  Has patient had a PCN reaction causing severe rash involving mucus membranes or skin necrosis: Has patient had a PCN reaction that required hospitalization  Has patient had a PCN reaction occurring within the last 10 years: If all of the above answers are "NO", then may proceed with Cephalosporin use.Pt s        Medication List     TAKE these medications    omeprazole 20 MG capsule Commonly known as: PRILOSEC Take 1 capsule (20 mg total) by mouth daily.   ondansetron 4 MG tablet Commonly known as: ZOFRAN Take 1 tablet (4 mg total) by mouth every 6 (six) hours as needed for nausea.  ONE-A-DAY WOMENS PO Take by mouth daily.        Follow-up Information     Chow, Bebe Shaggy, MD. Schedule an appointment as soon as possible for a visit in 1 week(s).   Specialty: Internal Medicine Contact information: [place] [address] [phone]                 Discharge Exam: Danley Danker Weights   10/08/21 1915  Weight: 97.5 kg   General: NAD, neck supple Cardiovascular: S1,  S2 present Respiratory: CTAB Abdomen: Soft, nontender, nondistended, bowel sounds present Musculoskeletal: No bilateral pedal edema noted Skin: Normal Psychiatry: Normal mood  Neurology: No obvious focal neurologic deficits noted    Condition at discharge: stable  The results of significant diagnostics from this hospitalization (including imaging, microbiology, ancillary and laboratory) are listed below for reference.   Imaging Studies: No results found.  Microbiology: Results for orders placed or performed during the hospital encounter of 10/08/21  Resp Panel by RT-PCR (Flu A&B, Covid) Nasopharyngeal Swab     Status: None   Collection Time: 10/08/21  9:43 PM   Specimen: Nasopharyngeal Swab; Nasopharyngeal(NP) swabs in vial transport medium  Result Value Ref Range Status   SARS Coronavirus 2 by RT PCR NEGATIVE NEGATIVE Final    Comment: (NOTE) SARS-CoV-2 target nucleic acids are NOT DETECTED.  The SARS-CoV-2 RNA is generally detectable in upper respiratory specimens during the acute phase of infection. The lowest concentration of SARS-CoV-2 viral copies this assay can detect is 138 copies/mL. A negative result does not preclude SARS-Cov-2 infection and should not be used as the sole basis for treatment or other patient management decisions. A negative result may occur with  improper specimen collection/handling, submission of specimen other than nasopharyngeal swab, presence of viral mutation(s) within the areas targeted by this assay, and inadequate number of viral copies(<138 copies/mL). A negative result must be combined with clinical observations, patient history, and epidemiological information. The expected result is Negative.  Fact Sheet for Patients:  EntrepreneurPulse.com.au  Fact Sheet for Healthcare Providers:  IncredibleEmployment.be  This test is no t yet approved or cleared by the [place] FDA and  has been  authorized for detection and/or diagnosis of SARS-CoV-2 by FDA under an Emergency Use Authorization (EUA). This EUA will remain  in effect (meaning this test can be used) for the duration of the COVID-19 declaration under Section 564(b)(1) of the Act, 21 U.S.C.section 360bbb-3(b)(1), unless the authorization is terminated  or revoked sooner.       Influenza A by PCR NEGATIVE NEGATIVE Final   Influenza B by PCR NEGATIVE NEGATIVE Final    Comment: (NOTE) The Xpert Xpress SARS-CoV-2/FLU/RSV plus assay is intended as an aid in the diagnosis of influenza from Nasopharyngeal swab specimens and should not be used as a sole basis for treatment. Nasal washings and aspirates are unacceptable for Xpert Xpress SARS-CoV-2/FLU/RSV testing.  Fact Sheet for Patients: EntrepreneurPulse.com.au  Fact Sheet for Healthcare Providers: IncredibleEmployment.be  This test is not yet approved or cleared by the [place] FDA and has been authorized for detection and/or diagnosis of SARS-CoV-2 by FDA under an Emergency Use Authorization (EUA). This EUA will remain in effect (meaning this test can be used) for the duration of the COVID-19 declaration under Section 564(b)(1) of the Act, 21 U.S.C. section 360bbb-3(b)(1), unless the authorization is terminated or revoked.  Performed at [facility], [address], [place], [place] [address]   CSF culture w Gram Stain  Status: None (Preliminary result)   Collection Time: 10/08/21 11:31 PM   Specimen: Lumbar Puncture; Cerebrospinal Fluid  Result Value Ref Range Status   Specimen Description   Final    LUMBAR Performed at [facility], [address], [place], [place] [address]    Special Requests   Final    Normal Performed at [facility], [place], [place] [address]    Gram Stain CYTOSPIN SMEAR RARE WBC SEEN NO ORGANISMS SEEN    Final   Culture   Final    NO GROWTH 2 DAYS Performed at [facility], [place] [address] [place], [place] [address]    Report Status PENDING  Incomplete    Labs: CBC: Recent Labs  Lab 10/08/21 2143 10/10/21 0359 10/11/21 0219  WBC 13.1* 9.8 12.3*  NEUTROABS 9.5*  --  7.1  HGB 13.0 13.2 13.1  HCT 39.2 38.6 39.1  MCV 92.2 91.3 91.1  PLT 245 219 [id number]   Basic Metabolic Panel: Recent Labs  Lab 10/08/21 2143 10/10/21 0359 10/11/21 0219  NA 138 139 138  K 3.5 3.6 3.4*  CL 108 109 106  CO2 21* 23 22  GLUCOSE 81 87 84  BUN 9 5* 9  CREATININE 0.71 0.85 0.81  CALCIUM 8.9 8.9 9.3  MG  --   --  1.9   Liver Function Tests: Recent Labs  Lab 10/08/21 2143  AST 13*  ALT 16  ALKPHOS 65  BILITOT 0.7  PROT 7.1  ALBUMIN 4.3   CBG: No results for input(s): GLUCAP in the last 168 hours.  Discharge time spent: greater than 30 minutes.  Signed: Alma Friendly, MD [facility] 10/11/2021

## 2021-10-11 NOTE — TOC Transition Note (Signed)
Transition of Care [facility](TOC) - CM/SW Discharge Note   Patient Details  Name: Michele Gibson MRN: [id number] Date of Birth: 09/03/1996  Transition of Care [facility](TOC) CM/SW Contact:  Michele BaloKelli F Zedric Deroy, RN Phone Number: 10/11/2021, 12:56 PM   Clinical Narrative:    Patient is discharging home with self care. No needs per TOC.   Final next level of care: Home/Self Care Barriers to Discharge: No Barriers Identified   Patient Goals and CMS Choice        Discharge Placement                       Discharge Plan and Services                                     Social Determinants of Health (SDOH) Interventions     Readmission Risk Interventions No flowsheet data found.

## 2021-10-12 LAB — CSF CULTURE W GRAM STAIN
Culture: NO GROWTH
Special Requests: NORMAL

## 2021-10-22 DIAGNOSIS — B009 Herpesviral infection, unspecified: Secondary | ICD-10-CM | POA: Insufficient documentation

## 2021-10-23 DIAGNOSIS — Z975 Presence of (intrauterine) contraceptive device: Secondary | ICD-10-CM | POA: Insufficient documentation

## 2021-10-29 DIAGNOSIS — A568 Sexually transmitted chlamydial infection of other sites: Secondary | ICD-10-CM | POA: Insufficient documentation

## 2021-11-07 DIAGNOSIS — A048 Other specified bacterial intestinal infections: Secondary | ICD-10-CM | POA: Insufficient documentation

## 2021-11-11 DIAGNOSIS — R768 Other specified abnormal immunological findings in serum: Secondary | ICD-10-CM | POA: Insufficient documentation

## 2021-11-14 DIAGNOSIS — R7989 Other specified abnormal findings of blood chemistry: Secondary | ICD-10-CM | POA: Insufficient documentation

## 2021-11-26 DIAGNOSIS — J302 Other seasonal allergic rhinitis: Secondary | ICD-10-CM | POA: Insufficient documentation

## 2022-04-23 DIAGNOSIS — A749 Chlamydial infection, unspecified: Secondary | ICD-10-CM | POA: Insufficient documentation

## 2022-04-25 ENCOUNTER — Ambulatory Visit (INDEPENDENT_AMBULATORY_CARE_PROVIDER_SITE_OTHER): Payer: BC Managed Care – PPO | Admitting: Obstetrics and Gynecology

## 2022-04-25 VITALS — BP 111/73 | HR 74 | Wt 219.0 lb

## 2022-04-25 DIAGNOSIS — Z3046 Encounter for surveillance of implantable subdermal contraceptive: Secondary | ICD-10-CM

## 2022-04-25 NOTE — Progress Notes (Signed)
     GYNECOLOGY OFFICE PROCEDURE NOTE  Michele Gibson is a 26 y.o. G1P1001 here for Nexplanon removal.  No other gynecologic concerns.  Nexplanon Removal Patient identified, informed consent performed, consent signed.   Appropriate time out taken. Nexplanon site identified.  Area prepped in usual sterile fashon. One ml of 1% lidocaine was used to anesthetize the area at the distal end of the implant. A small stab incision was made right beside the implant on the distal portion.  The Nexplanon rod was grasped using hemostats and removed without difficulty.  There was minimal blood loss. There were no complications.  3 ml of 1% lidocaine was injected around the incision for post-procedure analgesia.  Steri-strips were applied over the small incision.  A pressure bandage was applied to reduce any bruising.  The patient tolerated the procedure well and was given post procedure instructions.  Patient is planning to use attempt conception.    [name], MD, FACOG Obstetrician & Gynecologist, [facility] for [facility], [facility]

## 2022-04-25 NOTE — Progress Notes (Signed)
Pt is here today for Nexplanon removal - pt is wanting to conceive.

## 2022-10-25 DIAGNOSIS — L0292 Furuncle, unspecified: Secondary | ICD-10-CM | POA: Insufficient documentation

## 2022-10-25 DIAGNOSIS — Z0000 Encounter for general adult medical examination without abnormal findings: Secondary | ICD-10-CM | POA: Diagnosis not present

## 2022-10-25 DIAGNOSIS — Z113 Encounter for screening for infections with a predominantly sexual mode of transmission: Secondary | ICD-10-CM | POA: Diagnosis not present

## 2022-10-25 DIAGNOSIS — L989 Disorder of the skin and subcutaneous tissue, unspecified: Secondary | ICD-10-CM | POA: Insufficient documentation

## 2023-06-12 DIAGNOSIS — E559 Vitamin D deficiency, unspecified: Secondary | ICD-10-CM | POA: Diagnosis not present

## 2023-06-12 DIAGNOSIS — L732 Hidradenitis suppurativa: Secondary | ICD-10-CM | POA: Insufficient documentation

## 2023-06-12 DIAGNOSIS — G43009 Migraine without aura, not intractable, without status migrainosus: Secondary | ICD-10-CM | POA: Diagnosis not present

## 2023-06-12 DIAGNOSIS — N926 Irregular menstruation, unspecified: Secondary | ICD-10-CM | POA: Insufficient documentation

## 2023-06-12 DIAGNOSIS — Z1329 Encounter for screening for other suspected endocrine disorder: Secondary | ICD-10-CM | POA: Diagnosis not present

## 2023-06-23 ENCOUNTER — Ambulatory Visit
Admission: RE | Admit: 2023-06-23 | Discharge: 2023-06-23 | Disposition: A | Discharge status: Home / Self Care | Payer: BC Managed Care – PPO | Source: Ambulatory Visit | Attending: Physician Assistant | Admitting: Physician Assistant

## 2023-06-23 ENCOUNTER — Other Ambulatory Visit: Payer: Self-pay | Admitting: Physician Assistant

## 2023-06-23 DIAGNOSIS — L989 Disorder of the skin and subcutaneous tissue, unspecified: Secondary | ICD-10-CM

## 2023-06-23 DIAGNOSIS — R220 Localized swelling, mass and lump, head: Secondary | ICD-10-CM | POA: Diagnosis not present

## 2023-06-26 DIAGNOSIS — E559 Vitamin D deficiency, unspecified: Secondary | ICD-10-CM | POA: Insufficient documentation

## 2023-06-26 DIAGNOSIS — R1084 Generalized abdominal pain: Secondary | ICD-10-CM | POA: Insufficient documentation

## 2023-06-27 ENCOUNTER — Encounter: Payer: Self-pay | Admitting: Registered Nurse

## 2023-06-27 ENCOUNTER — Other Ambulatory Visit: Payer: Self-pay | Admitting: Physician Assistant

## 2023-06-27 DIAGNOSIS — L989 Disorder of the skin and subcutaneous tissue, unspecified: Secondary | ICD-10-CM

## 2023-08-13 DIAGNOSIS — D170 Benign lipomatous neoplasm of skin and subcutaneous tissue of head, face and neck: Secondary | ICD-10-CM | POA: Insufficient documentation

## 2023-09-12 ENCOUNTER — Other Ambulatory Visit: Payer: Self-pay | Admitting: Registered Nurse

## 2023-09-12 DIAGNOSIS — E281 Androgen excess: Secondary | ICD-10-CM

## 2023-09-15 ENCOUNTER — Ambulatory Visit
Admission: RE | Admit: 2023-09-15 | Discharge: 2023-09-15 | Discharge status: Home / Self Care | Payer: BC Managed Care – PPO | Source: Ambulatory Visit | Attending: Registered Nurse | Admitting: Registered Nurse

## 2023-09-15 DIAGNOSIS — E281 Androgen excess: Secondary | ICD-10-CM

## 2023-09-15 DIAGNOSIS — N83291 Other ovarian cyst, right side: Secondary | ICD-10-CM | POA: Diagnosis not present

## 2024-01-05 DIAGNOSIS — Z113 Encounter for screening for infections with a predominantly sexual mode of transmission: Secondary | ICD-10-CM | POA: Diagnosis not present

## 2024-01-05 DIAGNOSIS — N926 Irregular menstruation, unspecified: Secondary | ICD-10-CM | POA: Diagnosis not present

## 2024-01-05 DIAGNOSIS — Z0000 Encounter for general adult medical examination without abnormal findings: Secondary | ICD-10-CM | POA: Diagnosis not present

## 2024-01-05 DIAGNOSIS — Z124 Encounter for screening for malignant neoplasm of cervix: Secondary | ICD-10-CM | POA: Diagnosis not present

## 2024-01-05 DIAGNOSIS — Z1389 Encounter for screening for other disorder: Secondary | ICD-10-CM | POA: Diagnosis not present

## 2024-04-21 ENCOUNTER — Other Ambulatory Visit: Payer: Self-pay

## 2024-04-21 ENCOUNTER — Encounter: Payer: Self-pay | Admitting: Obstetrics and Gynecology

## 2024-04-21 ENCOUNTER — Ambulatory Visit: Admitting: Obstetrics and Gynecology

## 2024-04-21 VITALS — BP 112/75 | HR 97 | Wt 216.0 lb

## 2024-04-21 DIAGNOSIS — N939 Abnormal uterine and vaginal bleeding, unspecified: Secondary | ICD-10-CM | POA: Diagnosis not present

## 2024-04-21 DIAGNOSIS — Z1331 Encounter for screening for depression: Secondary | ICD-10-CM

## 2024-04-21 DIAGNOSIS — D369 Benign neoplasm, unspecified site: Secondary | ICD-10-CM

## 2024-04-21 DIAGNOSIS — Z3009 Encounter for other general counseling and advice on contraception: Secondary | ICD-10-CM | POA: Diagnosis not present

## 2024-04-21 MED ORDER — SLYND 4 MG PO TABS: 1.0000 | ORAL_TABLET | Freq: Every day | ORAL | 12 refills | Status: AC

## 2024-04-22 ENCOUNTER — Telehealth: Payer: Self-pay

## 2024-04-22 NOTE — Telephone Encounter (Signed)
 Called patient regarding pelvic US  that needs to be scheduled. Patient would like to call to get it scheduled as her work schedule varies. RN provided patient with US  number [phone].   Rosaline, RN

## 2024-04-26 NOTE — Progress Notes (Signed)
 NEW GYNECOLOGY PATIENT Patient name: BIJAL SIGLIN MRN [id number]  Date of birth: 11-15-1995 Chief Complaint:   Gynecologic Exam     History:  BREELY PANIK is a 28 y.o. G1P1001 being seen today for discussion of cyst and irregular periods.  Discussed the use of AI scribe software for clinical note transcription with the patient, who gave verbal consent to proceed.  History of Present Illness TAMYA DENARDO is a 28 year old female who presents with irregular periods and an ovarian cyst.  She has experienced irregular periods since discontinuing birth control two years ago. Previously, she was on Nexplanon  and Mirena since 2015 after the birth of her son. Her periods are characterized by spotting and clotting, occurring every two to three months, and lasting about a week and a half. The bleeding is not heavy, with clots passing primarily when using the bathroom. She also experiences cramps, sometimes even without menstruation.  She experiences hot flashes not associated with her menstrual cycle and has noticed excessive hair growth on her face, chest, back, and belly. She describes this hair growth as excessive and notes it grows back quickly after shaving. No breast or nipple changes or discharge are present.  She has a history of migraines since age 65, which have been persistent. Recently, her migraine medication was increased from 50 mg to 100 mg. Migraines are associated with nausea, for which she has nausea medication.  She mentions dietary challenges, noting that anything she eats causes stomach pain and cramping, leading to a weight loss of 11 pounds. Her average weight is around 225 pounds, but she has been eating less due to these symptoms.  An ultrasound in January revealed a dermoid cyst, and a previous ultrasound from 2014 measured the cyst at 3.7 cm. She has not experienced any pain during intercourse and has not had any recent changes in her medications aside from the migraine  medication adjustment.      Gynecologic History Patient's last menstrual period was 03/18/2024 (within days). Contraception: abstinence Last Pap: 12/2023 NILM, HPV negative Last Mammogram: n/a Last Colonoscopy: n/a  Obstetric History OB History  Gravida Para Term Preterm AB Living  1 1 1   1   SAB IAB Ectopic Multiple Live Births      1    # Outcome Date GA Lbr Len/2nd Weight Sex Type Anes PTL Lv  1 Term 07/15/13 [redacted]w[redacted]d 06:29 / 02:58 8 lb 7 oz (3.827 kg) M Vag-Spont EPI  LIV     Birth Comments: caput    Past Medical History:  Diagnosis Date   Chlamydial infection 04/23/2022   Furuncle 10/25/2022   Generalized abdominal pain 06/26/2023   GERD (gastroesophageal reflux disease)    H. pylori infection 11/07/2021   Headache(784.0)    migraines dx about 2 years ago   Hidradenitis suppurativa 06/12/2023   HSV (herpes simplex virus) infection 10/22/2021   HSV infection    Irregular periods 06/12/2023   Late prenatal care    Lipoma of skin and subcutaneous tissue of face 08/13/2023   Low vitamin D level 11/14/2021   Migraine    Nexplanon  in place 10/23/2021   No pertinent past medical history    Positive ANA (antinuclear antibody) 11/11/2021   Rheumatoid arthritis (HCC)    Seasonal allergic rhinitis 11/26/2021   Skin lesion 10/25/2022   Urine positive for Chlamydia trachomatis by PCR 10/29/2021   Vitamin D deficiency 06/26/2023    Past Surgical History:  Procedure Laterality Date  NO PAST SURGERIES      Current Outpatient Medications on File Prior to Visit  Medication Sig Dispense Refill   clindamycin  (CLINDAGEL) 1 % gel SMARTSIG:sparingly Topical Twice Daily     Cyanocobalamin (VITAMIN B 12 PO) Take by mouth.     Multiple Vitamin (MULTIVITAMIN ADULT PO) Take by mouth.     ondansetron  (ZOFRAN -ODT) 4 MG disintegrating tablet SMARTSIG:1 Tablet(s) Sublingual Every 6-8 Hours PRN     SUMAtriptan (IMITREX) 100 MG tablet Take 100 mg by mouth daily.     omeprazole   (PRILOSEC) 20 MG capsule Take 1 capsule (20 mg total) by mouth daily. (Patient not taking: Reported on 04/21/2024) 30 capsule 0   ondansetron  (ZOFRAN ) 4 MG tablet Take 1 tablet (4 mg total) by mouth every 6 (six) hours as needed for nausea. (Patient not taking: Reported on 04/21/2024) 20 tablet 0   No current facility-administered medications on file prior to visit.      Social History:  reports that she has never smoked. She has never used smokeless tobacco. She reports current alcohol use. She reports that she does not use drugs.  Family History  Problem Relation Age of Onset   Hypertension Mother    Anemia Mother    Migraines Mother    Lung cancer Mother    Diabetes Father    Deep vein thrombosis Maternal Grandmother    Hypertension Maternal Grandmother    Stroke Maternal Grandmother    Cancer Maternal Grandmother    Heart attack Paternal Grandmother    Hypertension Paternal Grandmother    Diabetes Paternal Grandmother    Gout Paternal Grandmother    Stomach cancer Paternal Uncle    Healthy Son     The following portions of the patient's history were reviewed and updated as appropriate: allergies, current medications, past family history, past medical history, past social history, past surgical history and problem list.  Review of Systems Pertinent items noted in HPI and remainder of comprehensive ROS otherwise negative.  Physical Exam:  BP 112/75   Pulse 97   Wt 216 lb (98 kg)   LMP 03/18/2024 (Within Days)   BMI 33.83 kg/m  Physical Exam Vitals and nursing note reviewed.  Constitutional:      Appearance: Normal appearance.  Cardiovascular:     Rate and Rhythm: Normal rate.  Pulmonary:     Effort: Pulmonary effort is normal.     Breath sounds: Normal breath sounds.  Neurological:     General: No focal deficit present.     Mental Status: She is alert and oriented to person, place, and time.  Psychiatric:        Mood and Affect: Mood normal.        Behavior:  Behavior normal.        Thought Content: Thought content normal.        Judgment: Judgment normal.      Assessment and Plan:   Assessment & Plan Irregular menstrual cycles Irregular cycles with infrequent periods and spotting, suspected to be due to hormonal imbalance indicated by elevated DHEA. - Prescribed Slynd  (drospirenone ) to regulate cycles. - Follow up in 2 months to assess response.  Ovarian dermoid cyst Ovarian dermoid cyst stable since 2014, benign and slow-growing. Discussed monitoring versus surgical removal. She opted for monitoring. - Schedule repeat ultrasound to monitor cyst. - Discuss surgical options if cyst size increases or symptoms develop.  Hirsutism Excessive hair growth.  Follow-up: Return in about 2 months (around 06/21/2024).  Carter Quarry, MD Obstetrician & Gynecologist, [facility] [employer], [facility]

## 2024-04-29 ENCOUNTER — Other Ambulatory Visit: Payer: Self-pay

## 2024-04-29 NOTE — Telephone Encounter (Signed)
 Received fax from [facility] indicating that Slynd  is not covered by pts plan, recommend alternative as Camila. Routed message to Dr. Jeralyn for advisement.   Cyndee Molt, RN

## 2024-04-30 ENCOUNTER — Other Ambulatory Visit: Payer: Self-pay

## 2024-04-30 NOTE — Progress Notes (Signed)
 Encounter opened in error.   Cyndee Molt, RN

## 2024-05-21 ENCOUNTER — Telehealth: Payer: Self-pay | Admitting: Lactation Services

## 2024-05-21 NOTE — Telephone Encounter (Signed)
 Awaiting determination  Michele Gibson (Michele Gibson) Need Help? Call us  at [phone] Status Sent to Plan today Drug Slynd  4MG  tablets ePA cloud logo Form [employer] Electronic PA Form [phone] NCPDP)

## 2024-05-26 NOTE — Telephone Encounter (Signed)
 Michele Gibson (KeyBETHA SIEMENS) PA Case ID #: [id number] Need Help? Call us  at [phone] Outcome Approved on September 12 by [employer] 2017 CaseId:[id number];Status:Approved;Review Type:Prior Auth;Coverage Start Date:04/21/2024;Coverage End Date:05/21/2025; Effective Date: 04/21/2024 Authorization Expiration Date: 05/21/2025  Called Pharmacy to let them know PA has been approved for Slynd . They do report patient has a deductible to be met before insurance will cover.   Called patient to inform her that Slynd  has been approved and notified her of deductible. She will contact Pharmacy to discuss.

## 2024-06-21 ENCOUNTER — Ambulatory Visit: Admitting: Obstetrics and Gynecology

## 2024-08-17 DIAGNOSIS — Z30013 Encounter for initial prescription of injectable contraceptive: Secondary | ICD-10-CM | POA: Diagnosis not present

## 2024-08-17 DIAGNOSIS — Z113 Encounter for screening for infections with a predominantly sexual mode of transmission: Secondary | ICD-10-CM | POA: Diagnosis not present

## 2024-08-17 DIAGNOSIS — Z01419 Encounter for gynecological examination (general) (routine) without abnormal findings: Secondary | ICD-10-CM | POA: Diagnosis not present

## 2024-08-17 DIAGNOSIS — Z3202 Encounter for pregnancy test, result negative: Secondary | ICD-10-CM | POA: Diagnosis not present
